# Patient Record
Sex: Male | Born: 1995 | Race: Black or African American | Hispanic: No | Marital: Single | Smoking: Former smoker
Health system: Southern US, Community
[De-identification: ages and names within clinical notes are randomized; demographics above are authoritative.]

## PROBLEM LIST (undated history)

## (undated) DIAGNOSIS — F329 Major depressive disorder, single episode, unspecified: Secondary | ICD-10-CM

## (undated) DIAGNOSIS — T1491XA Suicide attempt, initial encounter: Secondary | ICD-10-CM

## (undated) DIAGNOSIS — F32A Depression, unspecified: Secondary | ICD-10-CM

## (undated) DIAGNOSIS — F4310 Post-traumatic stress disorder, unspecified: Secondary | ICD-10-CM

## (undated) DIAGNOSIS — J45909 Unspecified asthma, uncomplicated: Secondary | ICD-10-CM

## (undated) DIAGNOSIS — F419 Anxiety disorder, unspecified: Secondary | ICD-10-CM

## (undated) HISTORY — PX: TONSILLECTOMY: SUR1361

---
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
## 2016-07-14 ENCOUNTER — Emergency Department (HOSPITAL_COMMUNITY)
Admission: EM | Admit: 2016-07-14 | Discharge: 2016-07-14 | Disposition: A | Discharge status: Home / Self Care | Payer: Medicaid Other | Attending: Emergency Medicine | Admitting: Emergency Medicine

## 2016-07-14 ENCOUNTER — Encounter (HOSPITAL_COMMUNITY): Payer: Self-pay | Admitting: Emergency Medicine

## 2016-07-14 ENCOUNTER — Emergency Department (HOSPITAL_COMMUNITY): Payer: Medicaid Other

## 2016-07-14 DIAGNOSIS — M25572 Pain in left ankle and joints of left foot: Secondary | ICD-10-CM | POA: Insufficient documentation

## 2016-07-14 DIAGNOSIS — Z7722 Contact with and (suspected) exposure to environmental tobacco smoke (acute) (chronic): Secondary | ICD-10-CM | POA: Diagnosis not present

## 2016-07-14 DIAGNOSIS — J45909 Unspecified asthma, uncomplicated: Secondary | ICD-10-CM | POA: Diagnosis not present

## 2016-07-14 DIAGNOSIS — Z79899 Other long term (current) drug therapy: Secondary | ICD-10-CM | POA: Diagnosis not present

## 2016-07-14 DIAGNOSIS — Z791 Long term (current) use of non-steroidal anti-inflammatories (NSAID): Secondary | ICD-10-CM | POA: Diagnosis not present

## 2016-07-14 HISTORY — DX: Unspecified asthma, uncomplicated: J45.909

## 2016-07-14 MED ORDER — IBUPROFEN 800 MG PO TABS
800.0000 mg | ORAL_TABLET | Freq: Once | ORAL | Status: AC
  Administered 2016-07-14: 800 mg via ORAL
  Filled 2016-07-14: qty 1

## 2016-07-14 MED ORDER — NAPROXEN 250 MG PO TABS: 250.0000 mg | ORAL_TABLET | Freq: Two times a day (BID) | ORAL | 0 refills | Status: DC

## 2016-07-14 NOTE — ED Triage Notes (Signed)
Pt reports right ankle injury occurring in 2012 that has had intermittently been giving pt pain.  Pt has no new injury to ankle, but does have bilateral ankle pain today.  PT was seen by pcp last week and was asked to be seen again in 1 month.  Pt ambulatory.

## 2016-07-14 NOTE — ED Notes (Signed)
Pt has had pain in right foot since 2012 when he injured that ankle (denies fracture). He has had pain in his left ankle since early this year when he came down on that ankle playing basket ball. Pt has bilateral dorsalis pedis pulses and cap refill < 3 seconds. No edema noted.

## 2016-07-14 NOTE — ED Provider Notes (Signed)
[facility]EMERGENCY DEPT Provider Note   CSN: [id number] Arrival date & time: 07/14/16  0911     History   Chief Complaint Chief Complaint  Patient presents with  . Ankle Pain    HPI Todd Long is a 20 y.o. male.  Todd Long is a 20 y.o. Male who is obese who presents to the emergency department with his mother complaining of bilateral ankle pain that is worse in his left ankle ongoing for several months. Patient reports he's been having right ankle pain for many years. He reports a left ankle pain has worsened recently. He denies any injury or trauma to the ankle. He would like me to check an x-ray of his left ankle today. He tells me he does lots of walking and this seems to exacerbate his pain. He reports his pain is worse on the lateral aspect of his left ankle. He reports swelling there intermittently. He denies swelling to his legs. He reports his pain is worse with movement. He denies fevers, numbness, tingling, weakness, calf pain, calf swelling, or other injury.   The history is provided by the patient and a parent. No language interpreter was used.  Ankle Pain   Pertinent negatives include no numbness.    Past Medical History:  Diagnosis Date  . Asthma     There are no active problems to display for this patient.   Past Surgical History:  Procedure Laterality Date  . TONSILLECTOMY         Home Medications    Prior to Admission medications   Medication Sig Start Date End Date Taking? Authorizing Provider  ibuprofen (ADVIL,MOTRIN) 200 MG tablet Take 400 mg by mouth every 6 (six) hours as needed for pain.    Historical Provider, MD  lisdexamfetamine (VYVANSE) 50 MG capsule Take 50 mg by mouth every morning.    Historical Provider, MD  naproxen (NAPROSYN) 250 MG tablet Take 1 tablet (250 mg total) by mouth 2 (two) times daily with a meal. 07/14/16   Everlene FarrierWilliam Jaesean Litzau, PA-C    Family History History reviewed. No pertinent family history.  Social  History Social History  Substance Use Topics  . Smoking status: Passive Smoke Exposure - Never Smoker  . Smokeless tobacco: Not on file  . Alcohol use No     Allergies   Patient has no known allergies.   Review of Systems Review of Systems  Constitutional: Negative for fever.  Cardiovascular: Negative for leg swelling.  Musculoskeletal: Positive for arthralgias and joint swelling. Negative for back pain.  Skin: Negative for color change, rash and wound.  Neurological: Negative for weakness and numbness.     Physical Exam Updated Vital Signs BP 136/82 (BP Location: Left Arm)   Pulse 86   Temp 98.4 F (36.9 C) (Oral)   Resp 20   Ht 5\' 9"  (1.753 m)   Wt (!) 158.8 kg   SpO2 94%   BMI 51.69 kg/m   Physical Exam  Constitutional: He appears well-developed and well-nourished. No distress.  Nontoxic appearing. Morbidly obese.  HENT:  Head: Normocephalic and atraumatic.  Eyes: Right eye exhibits no discharge. Left eye exhibits no discharge.  Cardiovascular: Normal rate, regular rhythm and intact distal pulses.   Bilateral dorsalis pedis and posterior tibialis pulses are intact. Good capillary refill to his bilateral distal toes.  Pulmonary/Chest: Effort normal. No respiratory distress.  Musculoskeletal: Normal range of motion. He exhibits tenderness. He exhibits no edema or deformity.  Mild tenderness to the lateral aspect  of his left ankle. No ankle edema noted. Good strength with plantar and dorsiflexion bilaterally. No calf edema or tenderness.  Neurological: He is alert. Coordination normal.  Sensation is intact in his bilateral lower extremities.  Skin: Skin is warm and dry. Capillary refill takes less than 2 seconds. No rash noted. He is not diaphoretic. No erythema. No pallor.  Psychiatric: He has a normal mood and affect. His behavior is normal.  Nursing note and vitals reviewed.    ED Treatments / Results  Labs (all labs ordered are listed, but only abnormal  results are displayed) Labs Reviewed - No data to display  EKG  EKG Interpretation None       Radiology Dg Ankle Complete Left  Result Date: 07/14/2016 CLINICAL DATA:  Chronic ankle pain EXAM: LEFT ANKLE COMPLETE - 3+ VIEW COMPARISON:  11/27/2013 FINDINGS: Three views of the left ankle submitted. No acute fracture or subluxation. Diffuse mild soft tissue swelling. Ankle mortise is preserved. IMPRESSION: No acute fracture or subluxation.  Diffuse soft tissue swelling. Electronically Signed   By: Natasha MeadLiviu  Pop M.D.   On: 07/14/2016 11:00    Procedures Procedures (including critical care time)  Medications Ordered in ED Medications  ibuprofen (ADVIL,MOTRIN) tablet 800 mg (800 mg Oral Given 07/14/16 1109)     Initial Impression / Assessment and Plan / ED Course  I have reviewed the triage vital signs and the nursing notes.  Pertinent labs & imaging results that were available during my care of the patient were reviewed by me and considered in my medical decision making (see chart for details).  Clinical Course     This is a 20 y.o. Male who is obese who presents to the emergency department with his mother complaining of bilateral ankle pain that is worse in his left ankle ongoing for several months. Patient reports he's been having right ankle pain for many years. He reports a left ankle pain has worsened recently. He denies any injury or trauma to the ankle. He would like me to check an x-ray of his left ankle today. He tells me he does lots of walking and this seems to exacerbate his pain. He reports his pain is worse on the lateral aspect of his left ankle. On exam the patient is afebrile nontoxic appearing. He is morbidly obese. He has some mild tenderness to the lateral aspect of his left ankle. No soft tissue edema noted. No deformity or ecchymosis noted. He is neurovascularly intact. X-ray shows no fracture or subluxation. Will provide the patient with an Ace wrap and discharged  with naproxen twice a day. I'm unsure of the cause of his atraumatic ankle pain. We will have him follow-up with orthopedic surgery if his pain persist as well as his primary care doctor. I advised the patient to follow-up with their primary care provider this week. I advised the patient to return to the emergency department with new or worsening symptoms or new concerns. The patient verbalized understanding and agreement with plan.      Final Clinical Impressions(s) / ED Diagnoses   Final diagnoses:  Acute left ankle pain    New Prescriptions New Prescriptions   NAPROXEN (NAPROSYN) 250 MG TABLET    Take 1 tablet (250 mg total) by mouth 2 (two) times daily with a meal.     Everlene FarrierWilliam Monia Timmers, PA-C 07/14/16 1114    Vanetta MuldersScott Zackowski, MD 07/15/16 1513

## 2017-10-11 ENCOUNTER — Emergency Department (HOSPITAL_COMMUNITY)
Admission: EM | Admit: 2017-10-11 | Discharge: 2017-10-11 | Disposition: A | Discharge status: Home / Self Care | Payer: Self-pay | Attending: Emergency Medicine | Admitting: Emergency Medicine

## 2017-10-11 ENCOUNTER — Encounter (HOSPITAL_COMMUNITY): Payer: Self-pay | Admitting: Emergency Medicine

## 2017-10-11 ENCOUNTER — Emergency Department (HOSPITAL_COMMUNITY): Payer: Self-pay

## 2017-10-11 DIAGNOSIS — J069 Acute upper respiratory infection, unspecified: Secondary | ICD-10-CM | POA: Insufficient documentation

## 2017-10-11 DIAGNOSIS — J45909 Unspecified asthma, uncomplicated: Secondary | ICD-10-CM | POA: Insufficient documentation

## 2017-10-11 DIAGNOSIS — Z7722 Contact with and (suspected) exposure to environmental tobacco smoke (acute) (chronic): Secondary | ICD-10-CM | POA: Insufficient documentation

## 2017-10-11 MED ORDER — PROMETHAZINE-DM 6.25-15 MG/5ML PO SYRP: 5.0000 mL | ORAL_SOLUTION | Freq: Four times a day (QID) | ORAL | 0 refills | Status: DC | PRN

## 2017-10-11 MED ORDER — ALBUTEROL SULFATE HFA 108 (90 BASE) MCG/ACT IN AERS
2.0000 | INHALATION_SPRAY | Freq: Once | RESPIRATORY_TRACT | Status: AC
  Administered 2017-10-11: 2 via RESPIRATORY_TRACT
  Filled 2017-10-11: qty 6.7

## 2017-10-11 MED ORDER — LIDOCAINE VISCOUS 2 % MT SOLN: 15.0000 mL | OROMUCOSAL | 0 refills | Status: DC | PRN

## 2017-10-11 MED ORDER — BENZONATATE 100 MG PO CAPS: 100.0000 mg | ORAL_CAPSULE | Freq: Three times a day (TID) | ORAL | 0 refills | Status: DC

## 2017-10-11 MED ORDER — AEROCHAMBER PLUS FLO-VU LARGE MISC: 1.0000 | Freq: Once | Status: DC

## 2017-10-11 NOTE — ED Provider Notes (Signed)
[facility] EMERGENCY DEPARTMENT Provider Note   CSN: [id number] Arrival date & time: 10/11/17  1446     History   Chief Complaint Chief Complaint  Patient presents with  . URI    HPI Todd Long is a 22 y.o. male resents to the emergency department with a chief complaint of productive cough, sore throat, nasal congestion for 4 days. He states that he checked his temperature earlier today and received a reading of either 103.0 or 100.3, but can't recall which one. He states that he woke up and his nose was dry and bleeding yesterday, which is since resolved.  He also notes one episode of NBNB emesis.  He was wheezing this morning, which is since resolved.  He denies chills, myalgias, nausea, diarrhea, abdominal pain, dyspnea, chest pain.  He is not up-to-date on his flu shot.  He works at a [employer] and has interacted with the public, but no known sick contacts.  He is treated his symptoms with Mucinex, last dose at 5 AM without improvement.  The history is provided by the patient. No language interpreter was used.  URI   Associated symptoms include vomiting (resolved), congestion, sore throat, cough and wheezing. Pertinent negatives include no chest pain, no abdominal pain, no diarrhea, no nausea, no ear pain, no sinus pain and no rash.    Past Medical History:  Diagnosis Date  . Asthma     There are no active problems to display for this patient.   Past Surgical History:  Procedure Laterality Date  . TONSILLECTOMY         Home Medications    Prior to Admission medications   Medication Sig Start Date End Date Taking? Authorizing Provider  benzonatate (TESSALON) 100 MG capsule Take 1 capsule (100 mg total) by mouth every 8 (eight) hours. 10/11/17   Caliber Landess A, PA-C  ibuprofen (ADVIL,MOTRIN) 200 MG tablet Take 400 mg by mouth every 6 (six) hours as needed for pain.    [provider]  lidocaine (XYLOCAINE) 2 % solution Use  as directed 15 mLs in the mouth or throat as needed for mouth pain. 10/11/17   Hana Trippett A, PA-C  lisdexamfetamine (VYVANSE) 50 MG capsule Take 50 mg by mouth every morning.    [provider]  naproxen (NAPROSYN) 250 MG tablet Take 1 tablet (250 mg total) by mouth 2 (two) times daily with a meal. 07/14/16   Everlene Farrier, PA-C  promethazine-dextromethorphan (PROMETHAZINE-DM) 6.25-15 MG/5ML syrup Take 5 mLs by mouth 4 (four) times daily as needed for cough. 10/11/17   Trinette Vera, Coral Else, PA-C    Family History History reviewed. No pertinent family history.  Social History Social History   Tobacco Use  . Smoking status: Passive Smoke Exposure - Never Smoker  . Smokeless tobacco: Never Used  Substance Use Topics  . Alcohol use: No  . Drug use: No     Allergies   Patient has no known allergies.   Review of Systems Review of Systems  Constitutional: Positive for fever. Negative for activity change.  HENT: Positive for congestion, nosebleeds (resolved), postnasal drip and sore throat. Negative for ear pain, facial swelling, sinus pressure and sinus pain.   Respiratory: Positive for cough and wheezing. Negative for shortness of breath.   Cardiovascular: Negative for chest pain.  Gastrointestinal: Positive for vomiting (resolved). Negative for abdominal pain, diarrhea and nausea.  Musculoskeletal: Negative for back pain.  Skin: Negative for rash.  Allergic/Immunologic: Negative for  immunocompromised state.  Neurological: Negative for dizziness and light-headedness.     Physical Exam Updated Vital Signs BP (!) 150/93 (BP Location: Right Arm)   Pulse 87   Temp 98.6 F (37 C) (Oral)   Resp 18   SpO2 99%   Physical Exam  Constitutional: He appears well-developed. No distress.  Obese, non-toxic appearing male.   HENT:  Head: Normocephalic.  Right Ear: Tympanic membrane, external ear and ear canal normal.  Left Ear: Tympanic membrane, external ear and ear canal  normal.  Nose: Mucosal edema and rhinorrhea present. Right sinus exhibits no maxillary sinus tenderness and no frontal sinus tenderness. Left sinus exhibits no maxillary sinus tenderness and no frontal sinus tenderness.  Mouth/Throat: Uvula is midline, oropharynx is clear and moist and mucous membranes are normal. No oropharyngeal exudate, posterior oropharyngeal edema, posterior oropharyngeal erythema or tonsillar abscesses.  Eyes: Conjunctivae are normal.  Neck: Neck supple.  Cardiovascular: Normal rate, regular rhythm, normal heart sounds and intact distal pulses. Exam reveals no gallop and no friction rub.  No murmur heard. Pulmonary/Chest: Effort normal. No stridor. No respiratory distress. He has no wheezes. He has no rales. He exhibits no tenderness.  Abdominal: Soft. He exhibits no distension.  Neurological: He is alert.  Skin: Skin is warm and dry. Capillary refill takes less than 2 seconds.  Psychiatric: His behavior is normal.  Nursing note and vitals reviewed.    ED Treatments / Results  Labs (all labs ordered are listed, but only abnormal results are displayed) Labs Reviewed - No data to display  EKG  EKG Interpretation None       Radiology Dg Chest 2 View  Result Date: 10/11/2017 CLINICAL DATA:  Cough, sore throat and nasal congestion for 3 days. EXAM: CHEST  2 VIEW COMPARISON:  PA and lateral chest 08/28/2007. FINDINGS: The lungs are clear. Heart size is normal. No pneumothorax or pleural effusion. No bony abnormality. IMPRESSION: Negative chest. Electronically Signed   By: Drusilla Kannerhomas  Dalessio M.D.   On: 10/11/2017 15:58    Procedures Procedures (including critical care time)  Medications Ordered in ED Medications  albuterol (PROVENTIL HFA;VENTOLIN HFA) 108 (90 Base) MCG/ACT inhaler 2 puff (not administered)  AEROCHAMBER PLUS FLO-VU LARGE MISC 1 each (not administered)     Initial Impression / Assessment and Plan / ED Course  I have reviewed the triage vital  signs and the nursing notes.  Pertinent labs & imaging results that were available during my care of the patient were reviewed by me and considered in my medical decision making (see chart for details).     22 year old male with a history of childhood asthma presenting with URI-like symptoms for 4 days.  No wheezing on my evaluation, but given history and patient's report of wheezing earlier today will provide the patient with an albuterol inhaler and spacer for home use. Pt CXR ordered by triage negative for acute infiltrate. Patients symptoms are consistent with URI, likely viral etiology. Discussed that antibiotics are not indicated for viral infections. Pt will be discharged with symptomatic treatment.  Verbalizes understanding and is agreeable with plan. Pt is well appearing, hemodynamically stable & in NAD prior to dc.  Final Clinical Impressions(s) / ED Diagnoses   Final diagnoses:  URI with cough and congestion    ED Discharge Orders        Ordered    promethazine-dextromethorphan (PROMETHAZINE-DM) 6.25-15 MG/5ML syrup  4 times daily PRN     10/11/17 1634    benzonatate (TESSALON) 100 MG  capsule  Every 8 hours     10/11/17 1634    lidocaine (XYLOCAINE) 2 % solution  As needed     10/11/17 1634       Hebe Merriwether A, PA-C 10/11/17 1639    Pricilla Loveless, MD 10/12/17 [phone]

## 2017-10-11 NOTE — ED Triage Notes (Signed)
Pt presents to ED for assessment of URI symptoms (cough, sore throat, nasal congestion) x 3 days, no fevers at home.

## 2017-10-11 NOTE — Discharge Instructions (Signed)
Take 2 puffs of the albuterol inhaler with the spacer every 4 hours as needed for wheezing or shortness of breath.  Drink 5 mL of Promethazine DM every 6 hours as needed for nasal congestion.  Take 1 tablet of benzonatate every 8 hours as needed for cough.  You can take 1 tablet of Benadryl at night to help you rest.  Sometimes the Promethazine DM will also make you somewhat drowsy.  Swallow 15 mL of viscous lidocaine every 3 hours as needed for sore throat.  Please do not take more than his account or take this medication more than every 3 hours due to side effects.  If you develop a fever, take 650 mg of Tylenol every 6 hours or 600 mg of ibuprofen with food every 8 hours.  Most employers recommend that you have been fever free, a temperature of 100.5, for more than 24 hours before returning to work.  If you develop new or worsening symptoms, including chest pain,  If you develop any new symptoms, such as shortness of breath, vomiting and diarrhea that persisted until you are not able to keep down any food or fluids, or other concerning symptoms, please return to the emergency department for re-evaluation.

## 2018-04-23 ENCOUNTER — Other Ambulatory Visit: Payer: Self-pay

## 2018-04-23 ENCOUNTER — Encounter (HOSPITAL_COMMUNITY): Payer: Self-pay | Admitting: *Deleted

## 2018-04-23 ENCOUNTER — Inpatient Hospital Stay (HOSPITAL_COMMUNITY)
Admission: AD | Admit: 2018-04-23 | Discharge: 2018-04-26 | DRG: 885 | Disposition: A | Discharge status: Home / Self Care | Payer: Federal, State, Local not specified - Other | Source: Intra-hospital | Attending: Psychiatry | Admitting: Psychiatry

## 2018-04-23 ENCOUNTER — Emergency Department (HOSPITAL_COMMUNITY)
Admission: EM | Admit: 2018-04-23 | Discharge: 2018-04-23 | Disposition: A | Discharge status: Other Acute Inpatient Hospital | Payer: Self-pay | Attending: Emergency Medicine | Admitting: Emergency Medicine

## 2018-04-23 DIAGNOSIS — Z8709 Personal history of other diseases of the respiratory system: Secondary | ICD-10-CM | POA: Diagnosis not present

## 2018-04-23 DIAGNOSIS — G4700 Insomnia, unspecified: Secondary | ICD-10-CM | POA: Diagnosis present

## 2018-04-23 DIAGNOSIS — I10 Essential (primary) hypertension: Secondary | ICD-10-CM | POA: Diagnosis present

## 2018-04-23 DIAGNOSIS — R45851 Suicidal ideations: Secondary | ICD-10-CM | POA: Insufficient documentation

## 2018-04-23 DIAGNOSIS — R440 Auditory hallucinations: Secondary | ICD-10-CM | POA: Insufficient documentation

## 2018-04-23 DIAGNOSIS — Z62810 Personal history of physical and sexual abuse in childhood: Secondary | ICD-10-CM | POA: Diagnosis present

## 2018-04-23 DIAGNOSIS — Z046 Encounter for general psychiatric examination, requested by authority: Secondary | ICD-10-CM | POA: Insufficient documentation

## 2018-04-23 DIAGNOSIS — Z7722 Contact with and (suspected) exposure to environmental tobacco smoke (acute) (chronic): Secondary | ICD-10-CM | POA: Insufficient documentation

## 2018-04-23 DIAGNOSIS — Z87891 Personal history of nicotine dependence: Secondary | ICD-10-CM

## 2018-04-23 DIAGNOSIS — J45909 Unspecified asthma, uncomplicated: Secondary | ICD-10-CM | POA: Insufficient documentation

## 2018-04-23 DIAGNOSIS — F329 Major depressive disorder, single episode, unspecified: Secondary | ICD-10-CM | POA: Insufficient documentation

## 2018-04-23 DIAGNOSIS — F4310 Post-traumatic stress disorder, unspecified: Secondary | ICD-10-CM | POA: Diagnosis present

## 2018-04-23 DIAGNOSIS — F410 Panic disorder [episodic paroxysmal anxiety] without agoraphobia: Secondary | ICD-10-CM | POA: Diagnosis present

## 2018-04-23 DIAGNOSIS — Z818 Family history of other mental and behavioral disorders: Secondary | ICD-10-CM | POA: Diagnosis not present

## 2018-04-23 DIAGNOSIS — Z9889 Other specified postprocedural states: Secondary | ICD-10-CM | POA: Diagnosis not present

## 2018-04-23 DIAGNOSIS — Z79899 Other long term (current) drug therapy: Secondary | ICD-10-CM | POA: Diagnosis not present

## 2018-04-23 DIAGNOSIS — Z9114 Patient's other noncompliance with medication regimen: Secondary | ICD-10-CM | POA: Insufficient documentation

## 2018-04-23 DIAGNOSIS — F332 Major depressive disorder, recurrent severe without psychotic features: Principal | ICD-10-CM | POA: Diagnosis present

## 2018-04-23 HISTORY — DX: Major depressive disorder, single episode, unspecified: F32.9

## 2018-04-23 HISTORY — DX: Anxiety disorder, unspecified: F41.9

## 2018-04-23 HISTORY — DX: Suicide attempt, initial encounter: T14.91XA

## 2018-04-23 HISTORY — DX: Post-traumatic stress disorder, unspecified: F43.10

## 2018-04-23 HISTORY — DX: Depression, unspecified: F32.A

## 2018-04-23 LAB — CBC
HCT: 45.7 % (ref 39.0–52.0)
HEMOGLOBIN: 15 g/dL (ref 13.0–17.0)
MCH: 29.8 pg (ref 26.0–34.0)
MCHC: 32.8 g/dL (ref 30.0–36.0)
MCV: 90.9 fL (ref 78.0–100.0)
Platelets: 297 10*3/uL (ref 150–400)
RBC: 5.03 MIL/uL (ref 4.22–5.81)
RDW: 14 % (ref 11.5–15.5)
WBC: 10.8 10*3/uL — ABNORMAL HIGH (ref 4.0–10.5)

## 2018-04-23 LAB — RAPID URINE DRUG SCREEN, HOSP PERFORMED
AMPHETAMINES: NOT DETECTED
BARBITURATES: NOT DETECTED
Benzodiazepines: NOT DETECTED
COCAINE: NOT DETECTED
OPIATES: NOT DETECTED
TETRAHYDROCANNABINOL: POSITIVE — AB

## 2018-04-23 LAB — COMPREHENSIVE METABOLIC PANEL
ALBUMIN: 3.9 g/dL (ref 3.5–5.0)
ALT: 30 U/L (ref 0–44)
AST: 20 U/L (ref 15–41)
Alkaline Phosphatase: 55 U/L (ref 38–126)
Anion gap: 9 (ref 5–15)
BUN: 19 mg/dL (ref 6–20)
CO2: 27 mmol/L (ref 22–32)
CREATININE: 0.92 mg/dL (ref 0.61–1.24)
Calcium: 9.2 mg/dL (ref 8.9–10.3)
Chloride: 106 mmol/L (ref 98–111)
GFR calc non Af Amer: >60 mL/min (ref >60)
GLUCOSE: 107 mg/dL — AB (ref 70–99)
Potassium: 3.9 mmol/L (ref 3.5–5.1)
SODIUM: 142 mmol/L (ref 135–145)
Total Bilirubin: 0.4 mg/dL (ref 0.3–1.2)
Total Protein: 7.1 g/dL (ref 6.5–8.1)

## 2018-04-23 LAB — ACETAMINOPHEN LEVEL

## 2018-04-23 LAB — SALICYLATE LEVEL

## 2018-04-23 LAB — ETHANOL: Alcohol, Ethyl (B): <10 mg/dL (ref <10)

## 2018-04-23 MED ORDER — ACETAMINOPHEN 325 MG PO TABS: 650.0000 mg | ORAL_TABLET | Freq: Four times a day (QID) | ORAL | Status: DC | PRN

## 2018-04-23 MED ORDER — ALUM & MAG HYDROXIDE-SIMETH 200-200-20 MG/5ML PO SUSP: 30.0000 mL | Freq: Four times a day (QID) | ORAL | Status: DC | PRN

## 2018-04-23 MED ORDER — ACETAMINOPHEN 325 MG PO TABS: 650.0000 mg | ORAL_TABLET | ORAL | Status: DC | PRN

## 2018-04-23 MED ORDER — MAGNESIUM HYDROXIDE 400 MG/5ML PO SUSP: 30.0000 mL | Freq: Every day | ORAL | Status: DC | PRN

## 2018-04-23 MED ORDER — TRAZODONE HCL 50 MG PO TABS
50.0000 mg | ORAL_TABLET | Freq: Every evening | ORAL | Status: DC | PRN
  Administered 2018-04-24 – 2018-04-25 (×2): 50 mg via ORAL
  Filled 2018-04-23 (×2): qty 1

## 2018-04-23 MED ORDER — ONDANSETRON HCL 4 MG PO TABS: 4.0000 mg | ORAL_TABLET | Freq: Three times a day (TID) | ORAL | Status: DC | PRN

## 2018-04-23 MED ORDER — HYDROXYZINE HCL 25 MG PO TABS
25.0000 mg | ORAL_TABLET | Freq: Three times a day (TID) | ORAL | Status: DC | PRN
  Filled 2018-04-23: qty 10

## 2018-04-23 MED ORDER — CLONAZEPAM 0.5 MG PO TABS
0.5000 mg | ORAL_TABLET | Freq: Once | ORAL | Status: AC
  Administered 2018-04-23: 0.5 mg via ORAL
  Filled 2018-04-23: qty 1

## 2018-04-23 MED ORDER — ALUM & MAG HYDROXIDE-SIMETH 200-200-20 MG/5ML PO SUSP: 30.0000 mL | ORAL | Status: DC | PRN

## 2018-04-23 NOTE — ED Notes (Signed)
BLUE, RED, GOLD save tube in main lab

## 2018-04-23 NOTE — ED Notes (Signed)
PT unable to provide urine sample at this time

## 2018-04-23 NOTE — [facility] Assessment (Signed)
[facility] Assessment Progress Note Case was staffed with Lord DNP who recommended a inpatient admission to assist with stabilization.       

## 2018-04-23 NOTE — ED Notes (Signed)
Pt A&O x 3, no distress noted, calm & cooperative, watching TV at present.  Pending report to [facility] and [facility] transfer at 9.30pm.  Report to RN Atkins, [facility].  Monitoring for safety, Q 15 min checks in effect.
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
## 2018-04-23 NOTE — BH Assessment (Signed)
BHH Assessment Progress Note Per AC patient has been accepted to [facility] 405-2 at 2200 hours. Paperwork completed.

## 2018-04-23 NOTE — ED Triage Notes (Signed)
Pt states for about a week he has had feelings of wanting to hurt self. Voices in head are telling him to do it but not how to actually harm self.

## 2018-04-23 NOTE — ED Provider Notes (Signed)
[facility]-EMERGENCY DEPT Provider Note   CSN: [id number] Arrival date & time: 04/23/18  1411     History   Chief Complaint Chief Complaint  Patient presents with  . Suicidal thoughts    HPI Todd Long is a 22 y.o. male, with a PMH of PTSD and anxiety presents with a one month history of suicidal thoughts. Pt states he does not have a plan. He notes a voice is telling him to kill himself. He denies a plan, HI, visual hallucinations. Pt states he was supposed to be on medication but has not been on it for 10 months due to lack of follow-up. Pt denies any medical PMH and any medical complaints.  HPI  Past Medical History:  Diagnosis Date  . Anxiety   . Asthma   . Depression   . PTSD (post-traumatic stress disorder)     There are no active problems to display for this patient.   Past Surgical History:  Procedure Laterality Date  . TONSILLECTOMY          Home Medications    Prior to Admission medications   Medication Sig Start Date End Date Taking? Authorizing Provider  Acetaminophen (PAIN RELIEVER PO) Take 2 tablets by mouth daily as needed (pain).   Yes [provider]  benzonatate (TESSALON) 100 MG capsule Take 1 capsule (100 mg total) by mouth every 8 (eight) hours. Patient not taking: Reported on 04/23/2018 10/11/17   McDonald, Mia A, PA-C  lidocaine (XYLOCAINE) 2 % solution Use as directed 15 mLs in the mouth or throat as needed for mouth pain. Patient not taking: Reported on 04/23/2018 10/11/17   McDonald, Pedro Earls A, PA-C  naproxen (NAPROSYN) 250 MG tablet Take 1 tablet (250 mg total) by mouth 2 (two) times daily with a meal. Patient not taking: Reported on 04/23/2018 07/14/16   Everlene Farrier, PA-C  promethazine-dextromethorphan (PROMETHAZINE-DM) 6.25-15 MG/5ML syrup Take 5 mLs by mouth 4 (four) times daily as needed for cough. Patient not taking: Reported on 04/23/2018 10/11/17   Barkley Boards, PA-C    Family History No family  history on file.  Social History Social History   Tobacco Use  . Smoking status: Passive Smoke Exposure - Never Smoker  . Smokeless tobacco: Never Used  Substance Use Topics  . Alcohol use: No  . Drug use: No     Allergies   Patient has no known allergies.   Review of Systems Review of Systems  Constitutional: Negative for chills and fever.  Respiratory: Negative for shortness of breath.   Cardiovascular: Negative for chest pain.  Gastrointestinal: Negative for abdominal pain, nausea and vomiting.  Psychiatric/Behavioral: Positive for hallucinations (auditory) and suicidal ideas. Negative for agitation.     Physical Exam Updated Vital Signs BP (!) 167/105   Pulse 83   Temp 98.1 F (36.7 C)   Resp 19   Ht 5\' 10"  (1.778 m)   SpO2 97%   BMI 50.22 kg/m   Physical Exam  Constitutional: He is oriented to person, place, and time. He appears well-developed and well-nourished.  HENT:  Head: Normocephalic and atraumatic.  Eyes: Conjunctivae and EOM are normal.  Neck: Neck supple.  Cardiovascular: Normal rate, regular rhythm and normal heart sounds.  No murmur heard. Pulmonary/Chest: Effort normal and breath sounds normal. No respiratory distress. He has no wheezes. He has no rales.  Abdominal: Soft. Bowel sounds are normal. He exhibits no distension. There is no tenderness.  Musculoskeletal: Normal range of motion. He  exhibits no tenderness or deformity.  Neurological: He is alert and oriented to person, place, and time.  Skin: Skin is warm and dry. No rash noted. No erythema.  Psychiatric: He has a normal mood and affect. His speech is normal and behavior is normal. Judgment normal. Thought content is not paranoid. He expresses suicidal ideation. He expresses no homicidal ideation. He expresses no suicidal plans.  Nursing note and vitals reviewed.    ED Treatments / Results  Labs (all labs ordered are listed, but only abnormal results are displayed) Labs Reviewed    CBC - Abnormal; Notable for the following components:      Result Value   WBC 10.8 (*)    All other components within normal limits  COMPREHENSIVE METABOLIC PANEL  ETHANOL  SALICYLATE LEVEL  ACETAMINOPHEN LEVEL  RAPID URINE DRUG SCREEN, HOSP PERFORMED    EKG None  Radiology No results found.  Procedures Procedures (including critical care time)  Medications Ordered in ED Medications - No data to display   Initial Impression / Assessment and Plan / ED Course  I have reviewed the triage vital signs and the nursing notes.  Pertinent labs & imaging results that were available during my care of the patient were reviewed by me and considered in my medical decision making (see chart for details).     3:36 PM Pt resting comfortably in bed, no acute distress, nontoxic, non lethargic. History is most consistent with advancement of his chronic psychiatric conditions. No emergent medical conditions identified at this time. Pt medically cleared for psych evaluation and possible placement.   Final Clinical Impressions(s) / ED Diagnoses   Final diagnoses:  None    ED Discharge Orders    None       Rueben Bash 04/23/18 1624    Azalia Bilis, MD 04/23/18 [phone]

## 2018-04-24 DIAGNOSIS — F332 Major depressive disorder, recurrent severe without psychotic features: Principal | ICD-10-CM

## 2018-04-24 LAB — LIPID PANEL
Cholesterol: 173 mg/dL (ref 0–200)
HDL: 39 mg/dL — ABNORMAL LOW (ref >40)
LDL CALC: 100 mg/dL — AB (ref 0–99)
Total CHOL/HDL Ratio: 4.4 RATIO
Triglycerides: 171 mg/dL — ABNORMAL HIGH (ref <150)
VLDL: 34 mg/dL (ref 0–40)

## 2018-04-24 LAB — TSH: TSH: 2.878 u[IU]/mL (ref 0.350–4.500)

## 2018-04-24 MED ORDER — PRAZOSIN HCL 1 MG PO CAPS
1.0000 mg | ORAL_CAPSULE | Freq: Every day | ORAL | Status: DC
  Administered 2018-04-24 – 2018-04-25 (×2): 1 mg via ORAL
  Filled 2018-04-24 (×2): qty 1
  Filled 2018-04-24: qty 7
  Filled 2018-04-24: qty 1
  Filled 2018-04-24: qty 7

## 2018-04-24 MED ORDER — VENLAFAXINE HCL ER 37.5 MG PO CP24
37.5000 mg | ORAL_CAPSULE | Freq: Every day | ORAL | Status: DC
  Administered 2018-04-24 – 2018-04-25 (×2): 37.5 mg via ORAL
  Filled 2018-04-24 (×4): qty 1

## 2018-04-24 NOTE — Progress Notes (Signed)
Todd Long is a 22 year old male pt admitted on voluntary basis. On admission he reports that he has been feeling depressed and suicidal and did endorse passive SI on admission about being tired of living but is able to contract for safety on the unit. He reports that he is not currently on any medications but spoke about how he would like to be on something that will help him. He denies any substance abuse issues. He reports that he was living in [place] for the past year and a half but recently moved back into the area. He reports that he currently lives with his mother and his brother and reports that he will go back to the same living situation upon discharge. Ngai was escorted to the unit, oriented to the milieu and safety maintained.

## 2018-04-24 NOTE — BHH Group Notes (Signed)
LCSW Group Therapy Note 04/24/2018 11:22 AM  Type of Therapy and Topic: Group Therapy: Overcoming Obstacles  Participation Level: Active  Description of Group:  In this group patients will be encouraged to explore what they see as obstacles to their own wellness and recovery. They will be guided to discuss their thoughts, feelings, and behaviors related to these obstacles. The group will process together ways to cope with barriers, with attention given to specific choices patients can make. Each patient will be challenged to identify changes they are motivated to make in order to overcome their obstacles. This group will be process-oriented, with patients participating in exploration of their own experiences as well as giving and receiving support and challenge from other group members.  Therapeutic Goals: 1. Patient will identify personal and current obstacles as they relate to admission. 2. Patient will identify barriers that currently interfere with their wellness or overcoming obstacles.  3. Patient will identify feelings, thought process and behaviors related to these barriers. 4. Patient will identify two changes they are willing to make to overcome these obstacles:   Summary of Patient Progress  Todd Long was engaged and participated throughout the group session. Todd Long reports that his obstacle currently is "my self-doubt". Todd Long states that he struggles with negative thinking and procrastination. Todd Long states that he knows once he is able to manage his symptoms and eliminate his negative thinking he "will be fine".    Therapeutic Modalities:  Cognitive Behavioral Therapy Solution Focused Therapy Motivational Interviewing Relapse Prevention Therapy   Alcario Drought Clinical Social Worker

## 2018-04-24 NOTE — BHH Group Notes (Signed)
Adult Psychoeducational Group Note  Date:  04/24/2018  Time: 3:15 PM  Group Topic/Focus: Self-Care  Participation Level:  Active  Participation Quality:  Appropriate and Attentive  Affect:  Appropriate  Cognitive:  Alert and Oriented  Insight: Improving  Engagement in Group:  Developing/Improving  Modes of Intervention:  Discussion and Education  Additional Comments:  Patient completed self-care assessment and contributed to the group discussion. Patient states he would like to isolate and work less and socialize with the outside world more.  Marchelle Folks A Greenlee Ancheta 04/24/2018 4:00 PM
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
## 2018-04-24 NOTE — Progress Notes (Cosign Needed)
Adult Psychoeducational Group Note  Date:  04/24/2018 Time:  7:07 PM  Group Topic/Focus:  Goals Group:   The focus of this group is to help patients establish daily goals to achieve during treatment and discuss how the patient can incorporate goal setting into their daily lives to aide in recovery.  Participation Level:  Active  Participation Quality:  Appropriate  Affect:  Appropriate  Cognitive:  Alert  Insight: Appropriate  Engagement in Group:  Engaged  Modes of Intervention:  Discussion  Additional Comments:  Pt attended group and participated in discussion.  Ashanti Littles R Akia Desroches 04/24/2018, 7:07 PM
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
## 2018-04-24 NOTE — Tx Team (Signed)
Initial Treatment Plan 04/24/2018 12:01 AM Sha Farrel Demark FTD:[id number]    PATIENT STRESSORS: Financial difficulties Marital or family conflict   PATIENT STRENGTHS: Ability for insight Average or above average intelligence Capable of independent living General fund of knowledge Motivation for treatment/growth Supportive family/friends   PATIENT IDENTIFIED PROBLEMS: Depression Suicidal thoughts "Need to find the right medicine"                     DISCHARGE CRITERIA:  Ability to meet basic life and health needs Improved stabilization in mood, thinking, and/or behavior Reduction of life-threatening or endangering symptoms to within safe limits Verbal commitment to aftercare and medication compliance  PRELIMINARY DISCHARGE PLAN: Attend aftercare/continuing care group Return to previous living arrangement  PATIENT/FAMILY INVOLVEMENT: This treatment plan has been presented to and reviewed with the patient, SHENANDOAH NORDLAND, and/or family member, .  The patient and family have been given the opportunity to ask questions and make suggestions.  Alezander Dimaano, [place], [place] 04/24/2018, 12:01 AM
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
## 2018-04-25 LAB — HEMOGLOBIN A1C
HEMOGLOBIN A1C: 5.6 % (ref 4.8–5.6)
MEAN PLASMA GLUCOSE: 114 mg/dL

## 2018-04-25 NOTE — Therapy (Signed)
Occupational Therapy Group Note  Date:  04/25/2018 Time:  2:40 PM  Group Topic/Focus:  Self Esteem Action Plan:   The focus of this group is to help patients create a plan to continue to build self-esteem after discharge.  Participation Level:  Active  Participation Quality:  Appropriate  Affect:  Pleasant/silly/joking  Cognitive:  Appropriate  Insight: Improving  Engagement in Group:  Engaged  Modes of Intervention:  Activity, Discussion, Education and Socialization  Additional Comments:    S: "It may not look like it, but my self esteem is pretty low"   O:Education given on self esteem, its definition, and how it becomes negative vs positive. Self esteem education given on its relation to MH. Pt encouraged to contribute in discussion and brainstorm. Self esteem activity completed where pt is to name a positive word for each letter of the alphabet (A-Z). Positive affirmations worksheet given at end of session for pt to practice and continue building this skill.   A: Pt presents to group with silly/joking affect, engaged and participatory throughout session with redirection. Pt occasionally talking over other members- responded well to redirection. Pt not willing to complete A-Z activity, stating "I'm bipolar I cannot just keep doing tasks my mind is all over". Educated on importance and purpose of distraction, pt still not willing. Pt completed coat of arms activity, main goal being to get clean. Accepted positive affirmations at end of session.   P: Handouts given to facilitate carryover into community.  Dalphine Handing, MSOT, OTR/L  [place] 04/25/2018, 2:40 PM

## 2018-04-25 NOTE — Progress Notes (Signed)
Adult Psychoeducational Group Note  Date:  04/25/2018 Time:  3:53 AM  Group Topic/Focus:  Wrap-Up Group:   The focus of this group is to help patients review their daily goal of treatment and discuss progress on daily workbooks.  Participation Level:  Active  Participation Quality:  Appropriate  Affect:  Appropriate  Cognitive:  Appropriate  Insight: Appropriate and Improving  Engagement in Group:  Engaged  Modes of Intervention:  Discussion  Additional Comments:  Pt stated his goal for today was to remain positive about his treatment. Pt stated he accomplished his goal today. Pt rated his over all day a 10. Pt stated he attend all groups held today. Pt stated he enjoyed all groups held today. Pt stated the groups help him interact and bond with his new peers.  Felipa Furnace 04/25/2018, 3:53 AM

## 2018-04-25 NOTE — BHH Group Notes (Signed)
[facility] Mental Health Association Group Therapy 04/25/2018 1:15pm  Type of Therapy: Mental Health Association Presentation  Participation Level: Active  Participation Quality: Attentive  Affect: Appropriate  Cognitive: Oriented  Insight: Developing/Improving  Engagement in Therapy: Engaged  Modes of Intervention: Discussion, Education and Socialization  Summary of Progress/Problems: Mental Health Association (MHA) Speaker came to talk about his personal journey with mental health. The pt processed ways by which to relate to the speaker. MHA speaker provided handouts and educational information pertaining to groups and services offered by the [facility]. Pt was engaged in speaker's presentation and was receptive to resources provided.    Rona Ravens, LCSW 04/25/2018 9:26 AM

## 2018-04-25 NOTE — Progress Notes (Signed)
Patient ID: LATASHA HOLGATE, male   DOB: 10/11/1995, 22 y.o.   MRN: [id number]  D: Patient observed to be sitting in the day room interacting with peers.  Pt loud, and animated, but interacting appropriately, and denies SI/HI/AVH.    A: Pt given all meds as scheduled.  Pt complained of insomnia, and was also given Trazodone 50mg  for sleep.  Q15 minute checks being maintained for pt's safety.  R: Pt denies any current concerns, will continue to monitor on Q15 minute safety checks.

## 2018-04-25 NOTE — [facility] Suicide Risk Assessment (Signed)
[facility] INPATIENT:  Family/Significant Other Suicide Prevention Education  Suicide Prevention Education:  Education Completed; Shalin Drewek, mother, [phone], has been identified by the patient as the family member/significant other with whom the patient will be residing, and identified as the person(s) who will aid the patient in the event of a mental health crisis (suicidal ideations/suicide attempt).  With written consent from the patient, the family member/significant other has been provided the following suicide prevention education, prior to the and/or following the discharge of the patient.  The suicide prevention education provided includes the following:  Suicide risk factors  Suicide prevention and interventions  National Suicide Hotline telephone number  [facility] assessment telephone number  [facility] Emergency Assistance 911  [facility] and/or Residential Mobile Crisis Unit telephone number  Request made of family/significant other to:  Remove weapons (e.g., guns, rifles, knives), all items previously/currently identified as safety concern.  No guns in the home per mom.   Remove drugs/medications (over-the-counter, prescriptions, illicit drugs), all items previously/currently identified as a safety concern.  The family member/significant other verbalizes understanding of the suicide prevention education information provided.  The family member/significant other agrees to remove the items of safety concern listed above.   She visited pt last night, he was talking and laughing, seems to be doing good.  But then whenever he gets upset, he will post something on facebook about "you guys will miss me when I'm gone."  Pt has good history and never has trouble finding a job.  He is mostly interested in his music.  Discussed probably discharge tomorrow.    Lorri Frederick, LCSW 04/25/2018, 3:02 PM

## 2018-04-25 NOTE — Progress Notes (Signed)
[facility] MD Progress Note  04/25/2018 11:37 AM Todd Long  MRN:  [id number] Subjective: Patient is seen and examined.  Patient is a 22 year old male with a past psychiatric history significant for major depression, probable posttraumatic stress disorder, as well as a history of attention deficit disorder.  He is seen in follow-up.  He is doing much better today.  He is able to smile and engage.  He is participating in groups.  He feels like the groups are helping him develop better coping skills.  He denied any nightmares or flashbacks with his medications last night.  His blood pressure has been elevated today.  He feels like he is progressing well.  He denied any suicidal ideation today. Principal Problem: <principal problem not specified> Diagnosis:   Patient Active Problem List   Diagnosis Date Noted  . Severe recurrent major depression without psychotic features (HCC) [F33.2] 04/23/2018   Total Time spent with patient: 15 minutes  Past Psychiatric History: See admission H&P  Past Medical History:  Past Medical History:  Diagnosis Date  . Anxiety   . Asthma   . Depression   . PTSD (post-traumatic stress disorder)   . Suicidal behavior with attempted self-injury [facility])     Past Surgical History:  Procedure Laterality Date  . TONSILLECTOMY     Family History: History reviewed. No pertinent family history. Family Psychiatric  History: See admission H&P Social History:  Social History   Substance and Sexual Activity  Alcohol Use No     Social History   Substance and Sexual Activity  Drug Use No    Social History   Socioeconomic History  . Marital status: Single    Spouse name: Not on file  . Number of children: Not on file  . Years of education: Not on file  . Highest education level: Not on file  Occupational History  . Not on file  Social Needs  . Financial resource strain: Not on file  . Food insecurity:    Worry: Not on file    Inability: Not on file  .  Transportation needs:    Medical: Not on file    Non-medical: Not on file  Tobacco Use  . Smoking status: Former Games developer  . Smokeless tobacco: Never Used  Substance and Sexual Activity  . Alcohol use: No  . Drug use: No  . Sexual activity: Not Currently  Lifestyle  . Physical activity:    Days per week: Not on file    Minutes per session: Not on file  . Stress: Not on file  Relationships  . Social connections:    Talks on phone: Not on file    Gets together: Not on file    Attends religious service: Not on file    Active member of club or organization: Not on file    Attends meetings of clubs or organizations: Not on file    Relationship status: Not on file  Other Topics Concern  . Not on file  Social History Narrative  . Not on file   Additional Social History:                         Sleep: Good  Appetite:  Good  Current Medications: Current Facility-Administered Medications  Medication Dose Route Frequency Provider Last Rate Last Dose  . acetaminophen (TYLENOL) tablet 650 mg  650 mg Oral Q6H PRN Jackelyn Poling, NP      . alum & mag hydroxide-simeth (  MAALOX/MYLANTA) 200-200-20 MG/5ML suspension 30 mL  30 mL Oral Q4H PRN Nira Conn A, NP      . hydrOXYzine (ATARAX/VISTARIL) tablet 25 mg  25 mg Oral TID PRN Nira Conn A, NP      . magnesium hydroxide (MILK OF MAGNESIA) suspension 30 mL  30 mL Oral Daily PRN Nira Conn A, NP      . prazosin (MINIPRESS) capsule 1 mg  1 mg Oral QHS Antonieta Pert, MD   1 mg at 04/24/18 2118  . traZODone (DESYREL) tablet 50 mg  50 mg Oral QHS PRN Nira Conn A, NP   50 mg at 04/24/18 2119  . venlafaxine XR (EFFEXOR-XR) 24 hr capsule 37.5 mg  37.5 mg Oral Q breakfast Antonieta Pert, MD   37.5 mg at 04/25/18 0827    Lab Results:  Results for orders placed or performed during the hospital encounter of 04/23/18 (from the past 48 hour(s))  Hemoglobin A1c     Status: None   Collection Time: 04/24/18  6:23 AM  Result  Value Ref Range   Hgb A1c MFr Bld 5.6 4.8 - 5.6 %    Comment: (NOTE)         Prediabetes: 5.7 - 6.4         Diabetes: >6.4         Glycemic control for adults with diabetes: <7.0    Mean Plasma Glucose 114 mg/dL    Comment: (NOTE) Performed At: [facility] [address] [place], [place] [id number] Jolene Schimke MD WU:[id number]   Lipid panel     Status: Abnormal   Collection Time: 04/24/18  6:23 AM  Result Value Ref Range   Cholesterol 173 0 - 200 mg/dL   Triglycerides 956 (H) <150 mg/dL   HDL 39 (L) >21 mg/dL   Total CHOL/HDL Ratio 4.4 RATIO   VLDL 34 0 - 40 mg/dL   LDL Cholesterol 308 (H) 0 - 99 mg/dL    Comment:        Total Cholesterol/HDL:CHD Risk Coronary Heart Disease Risk Table                     Men   Women  1/2 Average Risk   3.4   3.3  Average Risk       5.0   4.4  2 X Average Risk   9.6   7.1  3 X Average Risk  23.4   11.0        Use the calculated Patient Ratio above and the CHD Risk Table to determine the patient's CHD Risk.        ATP III CLASSIFICATION (LDL):  <100     mg/dL   Optimal  657-846  mg/dL   Near or Above                    Optimal  130-159  mg/dL   Borderline  962-952  mg/dL   High  >841     mg/dL   Very High Performed at [facility], [address], [place], [place] [address]   TSH     Status: None   Collection Time: 04/24/18  6:23 AM  Result Value Ref Range   TSH 2.878 0.350 - 4.500 uIU/mL    Comment: Performed by a 3rd Generation assay with a functional sensitivity of <=0.01 uIU/mL. Performed at [facility], [address] [place], [place] [address]     Blood  Alcohol level:  Lab Results  Component Value Date   ETH <10 04/23/2018    Metabolic Disorder Labs: Lab Results  Component Value Date   HGBA1C 5.6 04/24/2018   MPG 114 04/24/2018   No results found for: PROLACTIN Lab Results  Component Value Date   CHOL 173 04/24/2018   TRIG 171 (H) 04/24/2018   HDL 39 (L)  04/24/2018   CHOLHDL 4.4 04/24/2018   VLDL 34 04/24/2018   LDLCALC 100 (H) 04/24/2018    Physical Findings: AIMS: Facial and Oral Movements Muscles of Facial Expression: None, normal Lips and Perioral Area: None, normal Jaw: None, normal Tongue: None, normal,Extremity Movements Upper (arms, wrists, hands, fingers): None, normal Lower (legs, knees, ankles, toes): None, normal, Trunk Movements Neck, shoulders, hips: None, normal, Overall Severity Severity of abnormal movements (highest score from questions above): None, normal Incapacitation due to abnormal movements: None, normal Patient's awareness of abnormal movements (rate only patient's report): No Awareness, Dental Status Current problems with teeth and/or dentures?: No Does patient usually wear dentures?: No  CIWA:    COWS:     Musculoskeletal: Strength & Muscle Tone: within normal limits Gait & Station: normal Patient leans: N/A  Psychiatric Specialty Exam: Physical Exam  Nursing note and vitals reviewed. Constitutional: He is oriented to person, place, and time. He appears well-developed and well-nourished.  HENT:  Head: Normocephalic and atraumatic.  Respiratory: Effort normal.  Neurological: He is alert and oriented to person, place, and time.    ROS  Blood pressure (!) 151/83, pulse 98, temperature 97.9 F (36.6 C), temperature source Oral, resp. rate 20, height 5\' 8"  (1.727 m), weight (!) 169.6 kg.Body mass index is 56.87 kg/m.  General Appearance: Casual  Eye Contact:  Fair  Speech:  Normal Rate  Volume:  Normal  Mood:  Euthymic  Affect:  Congruent  Thought Process:  Coherent and Descriptions of Associations: Intact  Orientation:  Full (Time, Place, and Person)  Thought Content:  Logical  Suicidal Thoughts:  No  Homicidal Thoughts:  No  Memory:  Immediate;   Fair Recent;   Fair Remote;   Fair  Judgement:  Intact  Insight:  Good  Psychomotor Activity:  Normal  Concentration:  Concentration: Fair  and Attention Span: Fair  Recall:  [employer] of Knowledge:  Fair  Language:  Fair  Akathisia:  Negative  Handed:  Right  AIMS (if indicated):     Assets:  Communication Skills Desire for Improvement Financial Resources/Insurance Housing Physical Health Resilience Social Support  ADL's:  Intact  Cognition:  WNL  Sleep:  Number of Hours: 6.25     Treatment Plan Summary: Daily contact with patient to assess and evaluate symptoms and progress in treatment, Medication management and Plan : Patient is seen and examined.  Patient is a 22 year old male with the above-stated past psychiatric history is seen in follow-up.  #1 major depression/posttraumatic stress disorder-we will continue the venlafaxine XR 37.5 mg p.o. daily.  He has tolerated the prazosin 1 mg p.o. nightly for nightmares and flashbacks.  No change in these medications.  #2 hypertension we will discuss with him today possible treatment options for his blood pressure issues.  I want to make sure what he is been treated with in the past before we start anything.  #3 discharge planning-if all continues to go well we will plan on discharge in 1 to 2 days.  Antonieta Pert, MD 04/25/2018, 11:37 AM

## 2018-04-25 NOTE — Progress Notes (Signed)
Patient ID: Todd Long, male   DOB: [birth year], 22 y.o.   MRN: [id number]  Nursing Progress Note [id number]  Data: Patient presents with pleasant mood and animated/silly affect. Patient complaint with scheduled medications. Patient denies pain/physical complaints. Patient completed self-inventory sheet and rates depression, hopelessness, and anxiety 0,0,0 respectively. Patient rates their sleep and appetite as good/fair respectively. Patient states goal for today is to "continue to be positive and speak out more in group". Patient is seen attending groups and visible in the milieu. Patient currently denies SI/HI/AVH.   Action: Patient educated about and provided medication per provider's orders. Patient safety maintained with q15 min safety checks and frequent rounding. Low fall risk precautions in place. Emotional support given. 1:1 interaction and active listening provided. Patient encouraged to attend meals and groups. Patient encouraged to work on treatment plan and goals. Labs, vital signs and patient behavior monitored throughout shift.   Response: Patient remains safe on the unit at this time. Patient is interacting with peers appropriately on the unit. Will continue to support and monitor.

## 2018-04-25 NOTE — Progress Notes (Signed)
Pt has been in the dayroom most of the evening talking and socializing with other patients.  He was very jovial and laughing a lot.  He told Clinical research associate that he liked to make people laugh.  He denies SI/HI/AVH at this time.  He voiced no needs or concerns this evening.  Pt has been cooperative and appropriate.  Support and encouragement offered.  Discharge plans are in process.  Safety maintained with q15 minute checks.

## 2018-04-25 NOTE — Tx Team (Signed)
Interdisciplinary Treatment and Diagnostic Plan Update  04/25/2018 Time of Session: 0949 DAGAN TERRACINA MRN: [id number]  Principal Diagnosis: <principal problem not specified>  Secondary Diagnoses: Active Problems:   Severe recurrent major depression without psychotic features (HCC)   Current Medications:  Current Facility-Administered Medications  Medication Dose Route Frequency Provider Last Rate Last Dose  . acetaminophen (TYLENOL) tablet 650 mg  650 mg Oral Q6H PRN Jackelyn Poling, NP      . alum & mag hydroxide-simeth (MAALOX/MYLANTA) 200-200-20 MG/5ML suspension 30 mL  30 mL Oral Q4H PRN Nira Conn A, NP      . hydrOXYzine (ATARAX/VISTARIL) tablet 25 mg  25 mg Oral TID PRN Nira Conn A, NP      . magnesium hydroxide (MILK OF MAGNESIA) suspension 30 mL  30 mL Oral Daily PRN Nira Conn A, NP      . prazosin (MINIPRESS) capsule 1 mg  1 mg Oral QHS Antonieta Pert, MD   1 mg at 04/24/18 2118  . traZODone (DESYREL) tablet 50 mg  50 mg Oral QHS PRN Nira Conn A, NP   50 mg at 04/24/18 2119  . venlafaxine XR (EFFEXOR-XR) 24 hr capsule 37.5 mg  37.5 mg Oral Q breakfast Antonieta Pert, MD   37.5 mg at 04/25/18 0827   PTA Medications: Medications Prior to Admission  Medication Sig Dispense Refill Last Dose  . Acetaminophen (PAIN RELIEVER PO) Take 2 tablets by mouth daily as needed (pain).   Past Month at Unknown time  . benzonatate (TESSALON) 100 MG capsule Take 1 capsule (100 mg total) by mouth every 8 (eight) hours. (Patient not taking: Reported on 04/23/2018) 21 capsule 0 Not Taking at Unknown time  . lidocaine (XYLOCAINE) 2 % solution Use as directed 15 mLs in the mouth or throat as needed for mouth pain. (Patient not taking: Reported on 04/23/2018) 100 mL 0 Not Taking at Unknown time  . naproxen (NAPROSYN) 250 MG tablet Take 1 tablet (250 mg total) by mouth 2 (two) times daily with a meal. (Patient not taking: Reported on 04/23/2018) 30 tablet 0 Not Taking at Unknown time  .  promethazine-dextromethorphan (PROMETHAZINE-DM) 6.25-15 MG/5ML syrup Take 5 mLs by mouth 4 (four) times daily as needed for cough. (Patient not taking: Reported on 04/23/2018) 118 mL 0 Not Taking at Unknown time    Patient Stressors: Financial difficulties Marital or family conflict  Patient Strengths: Ability for insight Average or above average intelligence Capable of independent living [employer] of knowledge Motivation for treatment/growth Supportive family/friends  Treatment Modalities: Medication Management, Group therapy, Case management,  1 to 1 session with clinician, Psychoeducation, Recreational therapy.   Physician Treatment Plan for Primary Diagnosis: <principal problem not specified> Long Term Goal(s): Improvement in symptoms so as ready for discharge Improvement in symptoms so as ready for discharge   Short Term Goals: Ability to identify changes in lifestyle to reduce recurrence of condition will improve Ability to verbalize feelings will improve Ability to disclose and discuss suicidal ideas Ability to demonstrate self-control will improve Ability to identify and develop effective coping behaviors will improve Ability to maintain clinical measurements within normal limits will improve Ability to identify changes in lifestyle to reduce recurrence of condition will improve Ability to verbalize feelings will improve Ability to disclose and discuss suicidal ideas Ability to demonstrate self-control will improve Ability to identify and develop effective coping behaviors will improve Ability to maintain clinical measurements within normal limits will improve  Medication Management: Evaluate patient's response,  side effects, and tolerance of medication regimen.  Therapeutic Interventions: 1 to 1 sessions, Unit Group sessions and Medication administration.  Evaluation of Outcomes: Progressing  Physician Treatment Plan for Secondary Diagnosis: Active Problems:    Severe recurrent major depression without psychotic features (HCC)  Long Term Goal(s): Improvement in symptoms so as ready for discharge Improvement in symptoms so as ready for discharge   Short Term Goals: Ability to identify changes in lifestyle to reduce recurrence of condition will improve Ability to verbalize feelings will improve Ability to disclose and discuss suicidal ideas Ability to demonstrate self-control will improve Ability to identify and develop effective coping behaviors will improve Ability to maintain clinical measurements within normal limits will improve Ability to identify changes in lifestyle to reduce recurrence of condition will improve Ability to verbalize feelings will improve Ability to disclose and discuss suicidal ideas Ability to demonstrate self-control will improve Ability to identify and develop effective coping behaviors will improve Ability to maintain clinical measurements within normal limits will improve     Medication Management: Evaluate patient's response, side effects, and tolerance of medication regimen.  Therapeutic Interventions: 1 to 1 sessions, Unit Group sessions and Medication administration.  Evaluation of Outcomes: Progressing   RN Treatment Plan for Primary Diagnosis: <principal problem not specified> Long Term Goal(s): Knowledge of disease and therapeutic regimen to maintain health will improve  Short Term Goals: Ability to identify and develop effective coping behaviors will improve and Compliance with prescribed medications will improve  Medication Management: RN will administer medications as ordered by provider, will assess and evaluate patient's response and provide education to patient for prescribed medication. RN will report any adverse and/or side effects to prescribing provider.  Therapeutic Interventions: 1 on 1 counseling sessions, Psychoeducation, Medication administration, Evaluate responses to treatment, Monitor  vital signs and CBGs as ordered, Perform/monitor CIWA, COWS, AIMS and Fall Risk screenings as ordered, Perform wound care treatments as ordered.  Evaluation of Outcomes: Progressing   LCSW Treatment Plan for Primary Diagnosis: <principal problem not specified> Long Term Goal(s): Safe transition to appropriate next level of care at discharge, Engage patient in therapeutic group addressing interpersonal concerns.  Short Term Goals: Engage patient in aftercare planning with referrals and resources, Increase social support and Increase skills for wellness and recovery  Therapeutic Interventions: Assess for all discharge needs, 1 to 1 time with Social worker, Explore available resources and support systems, Assess for adequacy in community support network, Educate family and significant other(s) on suicide prevention, Complete Psychosocial Assessment, Interpersonal group therapy.  Evaluation of Outcomes: Progressing   Progress in Treatment: Attending groups: Yes. Participating in groups: Yes. Taking medication as prescribed: Yes. Toleration medication: Yes. Family/Significant other contact made: No, will contact:  mother Patient understands diagnosis: Yes. Discussing patient identified problems/goals with staff: Yes. Medical problems stabilized or resolved: Yes. Denies suicidal/homicidal ideation: Yes. Issues/concerns per patient self-inventory: No. Other: none  New problem(s) identified: No, Describe:  none  New Short Term/Long Term Goal(s):  Patient Goals:  "better mindset, remain positive"  Discharge Plan or Barriers:   Reason for Continuation of Hospitalization: Depression Medication stabilization  Estimated Length of Stay: 1-2 days.  Attendees: Patient:Todd Long 04/25/2018   Physician: Dr. Jola Babinski, MD 04/25/2018   Nursing: Thelma Comp, RN 04/25/2018   RN Care Manager: 04/25/2018   Social Worker: Daleen Squibb, LCSW 04/25/2018   Recreational Therapist:  04/25/2018    Other:  04/25/2018   Other:  04/25/2018  Other: 04/25/2018        Scribe  for Treatment Team: Wyn Quaker 04/25/2018 2:45 PM

## 2018-04-25 NOTE — Progress Notes (Signed)
Recreation Therapy Notes  Date: 9.11.19 Time: 0930 Location: [address] Dayroom  Group Topic: Stress Management  Goal Area(s) Addresses:  Patient will verbalize importance of using healthy stress management.  Patient will identify positive emotions associated with healthy stress management.   Intervention: Stress Management  Activity :  Guided Imagery.  LRT introduced the stress management technique of guided imagery.  LRT read a script for patients to envision seeing a starry sky at night.  Patients were to follow along as script was read to engage in activity.  Education:  Stress Management, Discharge Planning.   Education Outcome: Acknowledges edcuation/In group clarification offered/Needs additional education  Clinical Observations/Feedback: Pt did not attend group.    Ceairra Mccarver, LRT/CTRS         Starnisha Batrez A 04/25/2018 12:08 PM 

## 2018-04-26 MED ORDER — TRAZODONE HCL 100 MG PO TABS
100.0000 mg | ORAL_TABLET | Freq: Every evening | ORAL | Status: DC | PRN
  Filled 2018-04-26: qty 7

## 2018-04-26 MED ORDER — VENLAFAXINE HCL ER 75 MG PO CP24: 75.0000 mg | ORAL_CAPSULE | Freq: Every day | ORAL | 0 refills | Status: AC

## 2018-04-26 MED ORDER — TRAZODONE HCL 100 MG PO TABS: 100.0000 mg | ORAL_TABLET | Freq: Every evening | ORAL | 0 refills | Status: AC | PRN

## 2018-04-26 MED ORDER — HYDROXYZINE HCL 25 MG PO TABS: 25.0000 mg | ORAL_TABLET | Freq: Three times a day (TID) | ORAL | 0 refills | Status: DC | PRN

## 2018-04-26 MED ORDER — VENLAFAXINE HCL ER 75 MG PO CP24
75.0000 mg | ORAL_CAPSULE | Freq: Every day | ORAL | Status: DC
  Administered 2018-04-26: 75 mg via ORAL
  Filled 2018-04-26: qty 7
  Filled 2018-04-26: qty 1
  Filled 2018-04-26: qty 7

## 2018-04-26 MED ORDER — PRAZOSIN HCL 1 MG PO CAPS: 1.0000 mg | ORAL_CAPSULE | Freq: Every day | ORAL | 0 refills | Status: AC

## 2018-04-26 NOTE — BHH Suicide Risk Assessment (Signed)
[facility]   Principal Problem: <principal problem not specified> Discharge Diagnoses:  Patient Active Problem List   Diagnosis Date Noted  . Severe recurrent major depression without psychotic features (HCC) [F33.2] 04/23/2018    Total Time spent with patient: 15 minutes  Musculoskeletal: Strength & Muscle Tone: within normal limits Gait & Station: normal Patient leans: N/A  Psychiatric Specialty Exam: Review of Systems  All other systems reviewed and are negative.   Blood pressure (!) 121/101, pulse (!) 122, temperature 97.7 F (36.5 C), temperature source Oral, resp. rate 20, height 5\' 8"  (1.727 m), weight (!) 169.6 kg.Body mass index is 56.87 kg/m.  General Appearance: Casual  Eye Contact::  Good  Speech:  Normal Rate409  Volume:  Normal  Mood:  Euthymic  Affect:  Congruent  Thought Process:  Coherent and Descriptions of Associations: Intact  Orientation:  Full (Time, Place, and Person)  Thought Content:  Logical  Suicidal Thoughts:  No  Homicidal Thoughts:  No  Memory:  Immediate;   Fair Recent;   Fair Remote;   Fair  Judgement:  Intact  Insight:  Fair  Psychomotor Activity:  Normal  Concentration:  Good  Recall:  FiservFair  Fund of Knowledge:Fair  Language: Fair  Akathisia:  Negative  Handed:  Right  AIMS (if indicated):     Assets:  Communication Skills Desire for Improvement Housing Leisure Time Physical Health Resilience Social Support Talents/Skills  Sleep:  Number of Hours: 5.25  Cognition: WNL  ADL's:  Intact   Mental Status Per Nursing Assessment::   On Admission:  Suicidal ideation indicated by patient, Self-harm thoughts  Demographic Factors:  Male, Adolescent or young adult and Low socioeconomic status  Loss Factors: NA  Historical Factors: Impulsivity  Risk Reduction Factors:   Sense of responsibility to family, Employed, Living with another person, especially a relative, Positive social support and  Positive coping skills or problem solving skills  Continued Clinical Symptoms:  Depression:   Impulsivity  Cognitive Features That Contribute To Risk:  None    Suicide Risk:  Minimal: No identifiable suicidal ideation.  Patients presenting with no risk factors but with morbid ruminations; may be classified as minimal risk based on the severity of the depressive symptoms  Follow-up Information    [facility] on 05/01/2018.   Specialty:  Behavioral Health Why:  Please attend your appt on Tuesday, 05/01/18, at 8:00am. Contact information: [address] [place] [address] [phone]           Plan Of Care/Follow-up recommendations:  Activity:  ad lib  Antonieta PertGreg Lawson Clary, MD 04/26/2018, 7:31 AM
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
## 2018-04-26 NOTE — Progress Notes (Addendum)
  [facility] :  Will you be returning to the same living situation after discharge:  Yes,  with mom At discharge, do you have transportation home?: Yes,  mom Do you have the ability to pay for your medications: No. [facility].  Release of information consent forms completed and in the chart;  Patient's signature needed at discharge.  Patient to Follow up at: Follow-up Information    [facility] Go on 05/01/2018.   Specialty:  Behavioral Health Why:  Please attend your appt on Tuesday, 05/01/18, at 8:00am. Contact information: [address] [place] [address] [phone]           Next level of care provider has access to [facility] Link:no  Safety Planning and Suicide Prevention discussed: Yes,  with mom  Have you used any form of tobacco in the last 30 days? (Cigarettes, Smokeless Tobacco, Cigars, and/or Pipes): No  Has patient been referred to the Quitline?: N/A patient is not a smoker  Patient has been referred for addiction treatment: Yes  Lorri FrederickWierda, Todd Pressly Jon, LCSW 04/26/2018, 11:06 AM

## 2018-04-26 NOTE — Progress Notes (Signed)
D: Pt A & O X 4. Denies SI, HI, AVH and pain at this time. Pt reports fair sleep, good appetite, good concentration and is hyperactive and animated on interactions. Rates his depression, hopelessness and anxiety all 0/10. D/C home as ordered. Picked up in lobby by his mother. A: D/C instructions reviewed with pt including prescriptions, medication samples and follow up appointment; compliance encouraged. All belongings from locker # 22 given to pt at time of departure. Scheduled and PRN medications given with verbal education and effects monitored. Safety checks maintained without incident till time of d/c.  R: Pt receptive to care. Compliant with medications when offered. Denies adverse drug reactions when assessed. Verbalized understanding related to d/c instructions. Signed belonging sheet in agreement with items received from locker. Ambulatory with a steady gait. Appears to be in no physical distress at time of departure.

## 2019-09-08 ENCOUNTER — Other Ambulatory Visit: Payer: Self-pay

## 2019-09-08 ENCOUNTER — Emergency Department (HOSPITAL_COMMUNITY)
Admission: EM | Admit: 2019-09-08 | Discharge: 2019-09-08 | Disposition: A | Discharge status: Home / Self Care | Payer: Medicaid Other | Attending: Emergency Medicine | Admitting: Emergency Medicine

## 2019-09-08 ENCOUNTER — Encounter (HOSPITAL_COMMUNITY): Payer: Self-pay | Admitting: Emergency Medicine

## 2019-09-08 DIAGNOSIS — Z79899 Other long term (current) drug therapy: Secondary | ICD-10-CM | POA: Insufficient documentation

## 2019-09-08 DIAGNOSIS — R112 Nausea with vomiting, unspecified: Secondary | ICD-10-CM

## 2019-09-08 DIAGNOSIS — Z87891 Personal history of nicotine dependence: Secondary | ICD-10-CM | POA: Insufficient documentation

## 2019-09-08 MED ORDER — FAMOTIDINE 20 MG PO TABS: 20.0000 mg | ORAL_TABLET | Freq: Two times a day (BID) | ORAL | 0 refills | Status: DC

## 2019-09-08 NOTE — Discharge Instructions (Addendum)
You can slowly advance your diet as tolerated. Take Pepcid as needed for discomfort. Return to the ED if you start to develop worsening abdominal pain, chest pain, shortness of breath, vomiting or coughing up blood.

## 2019-09-08 NOTE — ED Triage Notes (Signed)
Pt reports multiple episodes of vomiting last night. Endorses nausea. Reports being able to keep down some Gatorade.

## 2019-09-08 NOTE — ED Provider Notes (Signed)
[facility] EMERGENCY DEPARTMENT Provider Note   CSN: [id number] Arrival date & time: 09/08/19  1605     History Chief Complaint  Patient presents with  . Emesis    Todd Long is a 24 y.o. male with a past medical history of anxiety, asthma, depression presenting to the ED with a chief complaint of emesis.  Yesterday has several episodes of nonbloody, nonbilious emesis.  He woke up this morning felt nauseous but was able to hold down Gatorade.  He was concerned due to working at [employer].  He denies any prior or current abdominal pain, diarrhea, sick contacts or similar symptoms, fever, prior abdominal surgeries, urinary symptoms.  HPI     Past Medical History:  Diagnosis Date  . Anxiety   . Asthma   . Depression   . PTSD (post-traumatic stress disorder)   . Suicidal behavior with attempted self-injury [facility])     Patient Active Problem List   Diagnosis Date Noted  . 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 TONSILLECTOMY         No family history on file.  Social History   Tobacco Use  . Smoking status: Former Games developer  . Smokeless tobacco: Never Used  Substance Use Topics  . Alcohol use: No  . Drug use: No    Home Medications Prior to Admission medications   Medication Sig Start Date End Date Taking? Authorizing Provider  famotidine (PEPCID) 20 MG tablet Take 1 tablet (20 mg total) by mouth 2 (two) times daily. 09/08/19   Zyquan Crotty, PA-C  hydrOXYzine (ATARAX/VISTARIL) 25 MG tablet Take 1 tablet (25 mg total) by mouth 3 (three) times daily as needed for anxiety. 04/26/18   Money, Gerlene Burdock, FNP  prazosin (MINIPRESS) 1 MG capsule Take 1 capsule (1 mg total) by mouth at bedtime. For nightmares 04/26/18   Money, Gerlene Burdock, FNP  traZODone (DESYREL) 100 MG tablet Take 1 tablet (100 mg total) by mouth at bedtime as needed for sleep. 04/26/18   Money,  Gerlene Burdock, FNP  venlafaxine XR (EFFEXOR-XR) 75 MG 24 hr capsule Take 1 capsule (75 mg total) by mouth daily with breakfast. For mood ocntrol 04/27/18   Money, Gerlene Burdock, FNP    Allergies    Patient has no known allergies.  Review of Systems   Review of Systems  Constitutional: Negative for appetite change, chills and fever.  HENT: Negative for ear pain, rhinorrhea, sneezing and sore throat.   Eyes: Negative for photophobia and visual disturbance.  Respiratory: Negative for cough, chest tightness, shortness of breath and wheezing.   Cardiovascular: Negative for chest pain and palpitations.  Gastrointestinal: Positive for nausea and vomiting. Negative for abdominal pain, blood in stool, constipation and diarrhea.  Genitourinary: Negative for dysuria, hematuria and urgency.  Musculoskeletal: Negative for myalgias.  Skin: Negative for rash.  Neurological: Negative for dizziness, weakness and light-headedness.    Physical Exam Updated Vital Signs BP 129/81 (BP Location: Right Arm)   Pulse 86   Temp 97.7 F (36.5 C) (Oral)   Resp 18   SpO2 97%   Physical Exam Vitals and nursing note reviewed.  Constitutional:      General: He is not in acute distress.    Appearance: He is well-developed. He is obese.  HENT:     Head: Normocephalic and atraumatic.     Nose: Nose normal.  Eyes:  General: No scleral icterus.       Left eye: No discharge.     Conjunctiva/sclera: Conjunctivae normal.  Cardiovascular:     Rate and Rhythm: Normal rate and regular rhythm.     Heart sounds: Normal heart sounds. No murmur. No friction rub. No gallop.   Pulmonary:     Effort: Pulmonary effort is normal. No respiratory distress.     Breath sounds: Normal breath sounds.  Abdominal:     General: Bowel sounds are normal. There is no distension.     Palpations: Abdomen is soft.     Tenderness: There is no abdominal tenderness. There is no guarding.  Musculoskeletal:        General: Normal range of  motion.     Cervical back: Normal range of motion and neck supple.  Skin:    General: Skin is warm and dry.     Findings: No rash.  Neurological:     Mental Status: He is alert.     Motor: No abnormal muscle tone.     Coordination: Coordination normal.     ED Results / Procedures / Treatments   Labs (all labs ordered are listed, but only abnormal results are displayed) Labs Reviewed - No data to display  EKG None  Radiology No results found.  Procedures Procedures (including critical care time)  Medications Ordered in ED Medications - No data to display  ED Course  I have reviewed the triage vital signs and the nursing notes.  Pertinent labs & imaging results that were available during my care of the patient were reviewed by me and considered in my medical decision making (see chart for details).    MDM Rules/Calculators/A&P                      24 year old male presenting to the ED for emesis since yesterday.  Symptoms resolved when he woke up this morning and was able to tolerate p.o. intake without difficulty.  His main reason of presenting to the ED today is because he works at [employer] and was concerned due to his symptoms.  On exam his abdomen is soft, nontender nondistended.  He denies abdominal pain, urinary symptoms or changes to bowel movements.  I suspect that his symptoms could be viral in nature as his symptoms resolved without intervention and he is able to tolerate p.o. intake here.  He is comfortable with discharge home with a work note and slowly advancing his diet as tolerated.  I doubt appendicitis, cholecystitis or other emergent cause of symptoms based on his reassuring work-up and vital signs.  Patient given return precautions.  Patient is hemodynamically stable, in NAD, and able to ambulate in the ED. Evaluation does not show pathology that would require ongoing emergent intervention or inpatient treatment. I explained the diagnosis to the patient.  Pain has been managed and has no complaints prior to discharge. Patient is comfortable with above plan and is stable for discharge at this time. All questions were answered prior to disposition. Strict return precautions for returning to the ED were discussed. Encouraged follow up with PCP.   An After Visit Summary was printed and given to the patient.   Portions of this note were generated with Scientist, clinical (histocompatibility and immunogenetics). Dictation errors may occur despite best attempts at proofreading.  Final Clinical Impression(s) / ED Diagnoses Final diagnoses:  Non-intractable vomiting with nausea, unspecified vomiting type    Rx / DC Orders ED Discharge Orders  Ordered    famotidine (PEPCID) 20 MG tablet  2 times daily     09/08/19 1724           Delia Heady, PA-C 09/08/19 1724    Wyvonnia Dusky, MD 09/08/19 2101

## 2019-10-31 ENCOUNTER — Encounter (HOSPITAL_COMMUNITY): Payer: Self-pay

## 2019-10-31 ENCOUNTER — Emergency Department (HOSPITAL_COMMUNITY)
Admission: EM | Admit: 2019-10-31 | Discharge: 2019-10-31 | Disposition: A | Discharge status: Home / Self Care | Payer: HRSA Program | Attending: Emergency Medicine | Admitting: Emergency Medicine

## 2019-10-31 ENCOUNTER — Other Ambulatory Visit: Payer: Self-pay

## 2019-10-31 DIAGNOSIS — Z87891 Personal history of nicotine dependence: Secondary | ICD-10-CM | POA: Diagnosis not present

## 2019-10-31 DIAGNOSIS — Z20822 Contact with and (suspected) exposure to covid-19: Secondary | ICD-10-CM | POA: Diagnosis not present

## 2019-10-31 LAB — SARS CORONAVIRUS 2 (TAT 6-24 HRS): SARS Coronavirus 2: NEGATIVE

## 2019-10-31 NOTE — ED Triage Notes (Signed)
Pt requesting a COVID test due to exposure to someone he lives with, denies any symptoms.

## 2019-11-04 NOTE — ED Provider Notes (Signed)
[facility] EMERGENCY DEPARTMENT Provider Note   CSN: [id number] Arrival date & time: 10/31/19  0730     History No chief complaint on file.   Todd Long is a 24 y.o. male.  HPI     24 year old male comes in a chief complaint of Covid exposure.  Patient reports that his spouse was diagnosed with COVID-19.  He has been around her.  He is not having any symptoms of COVID-19 at this time.  Particularly there is no nausea, vomiting, diarrhea, fevers, chills, chest pain, cough, shortness of breath.  Past Medical History:  Diagnosis Date  . Anxiety   . Asthma   . Depression   . PTSD (post-traumatic stress disorder)   . Suicidal behavior with attempted self-injury [facility])     Patient Active Problem List   Diagnosis Date Noted  . 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 TONSILLECTOMY         No family history on file.  Social History   Tobacco Use  . Smoking status: Former Games developer  . Smokeless tobacco: Never Used  Substance Use Topics  . Alcohol use: No  . Drug use: No    Home Medications Prior to Admission medications   Medication Sig Start Date End Date Taking? Authorizing Provider  famotidine (PEPCID) 20 MG tablet Take 1 tablet (20 mg total) by mouth 2 (two) times daily. 09/08/19   Khatri, Hina, PA-C  hydrOXYzine (ATARAX/VISTARIL) 25 MG tablet Take 1 tablet (25 mg total) by mouth 3 (three) times daily as needed for anxiety. 04/26/18   Money, Gerlene Burdock, FNP  prazosin (MINIPRESS) 1 MG capsule Take 1 capsule (1 mg total) by mouth at bedtime. For nightmares 04/26/18   Money, Gerlene Burdock, FNP  traZODone (DESYREL) 100 MG tablet Take 1 tablet (100 mg total) by mouth at bedtime as needed for sleep. 04/26/18   Money, Gerlene Burdock, FNP  venlafaxine XR (EFFEXOR-XR) 75 MG 24 hr capsule Take 1 capsule (75 mg total) by mouth daily with breakfast. For mood ocntrol 04/27/18    Money, Gerlene Burdock, FNP    Allergies    Patient has no known allergies.  Review of Systems   Review of Systems  Constitutional: Negative for activity change, chills and fever.  Eyes: Negative for visual disturbance.  Respiratory: Negative for cough, chest tightness and shortness of breath.   Cardiovascular: Negative for chest pain.  Gastrointestinal: Negative for abdominal distention, nausea and vomiting.  Genitourinary: Negative for difficulty urinating, dysuria and enuresis.  Musculoskeletal: Negative for myalgias, neck pain and neck stiffness.  Skin: Negative for rash.  Neurological: Positive for dizziness. Negative for light-headedness and headaches.  Hematological: Does not bruise/bleed easily.  Psychiatric/Behavioral: Negative for confusion.    Physical Exam Updated Vital Signs BP (!) 153/100 (BP Location: Right Wrist)   Pulse 90   Temp 98.5 F (36.9 C) (Oral)   Resp 18   Ht 5\' 9"  (1.753 m)   Wt (!) 173.7 kg   SpO2 96%   BMI 56.56 kg/m   Physical Exam Vitals and nursing note reviewed.  Constitutional:      Appearance: He is well-developed.  HENT:     Head: Atraumatic.  Cardiovascular:     Rate and Rhythm: Normal rate.  Pulmonary:     Effort: Pulmonary effort is normal.  Musculoskeletal:     Cervical back: Neck supple.  Skin:    General: Skin  is warm.  Neurological:     Mental Status: He is alert and oriented to person, place, and time.     ED Results / Procedures / Treatments   Labs (all labs ordered are listed, but only abnormal results are displayed) Labs Reviewed  SARS CORONAVIRUS 2 (TAT 6-24 HRS)    EKG None  Radiology No results found.  Procedures Procedures (including critical care time)  Medications Ordered in ED Medications - No data to display  ED Course  I have reviewed the triage vital signs and the nursing notes.  Pertinent labs & imaging results that were available during my care of the patient were reviewed by me and  considered in my medical decision making (see chart for details).    MDM Rules/Calculators/A&P                      24 year old asymptomatic patient comes in a chief complaint of Covid exposure.  His spouse was diagnosed with COVID-19.  We will provide appropriate work and also test him for COVID-19.  Final Clinical Impression(s) / ED Diagnoses Final diagnoses:  Close exposure to COVID-19 virus    Rx / DC Orders ED Discharge Orders    None       Varney Biles, MD 11/04/19 [phone]

## 2020-01-03 ENCOUNTER — Encounter (HOSPITAL_COMMUNITY): Payer: Self-pay | Admitting: Emergency Medicine

## 2020-01-03 ENCOUNTER — Emergency Department (HOSPITAL_COMMUNITY)
Admission: EM | Admit: 2020-01-03 | Discharge: 2020-01-03 | Disposition: A | Discharge status: Home / Self Care | Payer: Medicaid Other | Attending: Emergency Medicine | Admitting: Emergency Medicine

## 2020-01-03 DIAGNOSIS — J45909 Unspecified asthma, uncomplicated: Secondary | ICD-10-CM | POA: Insufficient documentation

## 2020-01-03 DIAGNOSIS — R1084 Generalized abdominal pain: Secondary | ICD-10-CM | POA: Insufficient documentation

## 2020-01-03 DIAGNOSIS — Z79899 Other long term (current) drug therapy: Secondary | ICD-10-CM | POA: Insufficient documentation

## 2020-01-03 DIAGNOSIS — Z87891 Personal history of nicotine dependence: Secondary | ICD-10-CM | POA: Insufficient documentation

## 2020-01-03 DIAGNOSIS — R112 Nausea with vomiting, unspecified: Secondary | ICD-10-CM | POA: Insufficient documentation

## 2020-01-03 DIAGNOSIS — R197 Diarrhea, unspecified: Secondary | ICD-10-CM | POA: Insufficient documentation

## 2020-01-03 LAB — COMPREHENSIVE METABOLIC PANEL
ALT: 24 U/L (ref 0–44)
AST: 19 U/L (ref 15–41)
Albumin: 3.5 g/dL (ref 3.5–5.0)
Alkaline Phosphatase: 35 U/L — ABNORMAL LOW (ref 38–126)
Anion gap: 9 (ref 5–15)
BUN: 13 mg/dL (ref 6–20)
CO2: 24 mmol/L (ref 22–32)
Calcium: 8.6 mg/dL — ABNORMAL LOW (ref 8.9–10.3)
Chloride: 104 mmol/L (ref 98–111)
Creatinine, Ser: 0.97 mg/dL (ref 0.61–1.24)
GFR calc Af Amer: >60 mL/min (ref >60)
GFR calc non Af Amer: >60 mL/min (ref >60)
Glucose, Bld: 99 mg/dL (ref 70–99)
Potassium: 3.9 mmol/L (ref 3.5–5.1)
Sodium: 137 mmol/L (ref 135–145)
Total Bilirubin: 0.3 mg/dL (ref 0.3–1.2)
Total Protein: 6.2 g/dL — ABNORMAL LOW (ref 6.5–8.1)

## 2020-01-03 LAB — CBC
HCT: 47.9 % (ref 39.0–52.0)
Hemoglobin: 15.4 g/dL (ref 13.0–17.0)
MCH: 29.8 pg (ref 26.0–34.0)
MCHC: 32.2 g/dL (ref 30.0–36.0)
MCV: 92.8 fL (ref 80.0–100.0)
Platelets: 294 10*3/uL (ref 150–400)
RBC: 5.16 MIL/uL (ref 4.22–5.81)
RDW: 12.9 % (ref 11.5–15.5)
WBC: 9.3 10*3/uL (ref 4.0–10.5)
nRBC: 0 % (ref 0.0–0.2)

## 2020-01-03 LAB — URINALYSIS, ROUTINE W REFLEX MICROSCOPIC
Bilirubin Urine: NEGATIVE
Glucose, UA: NEGATIVE mg/dL
Hgb urine dipstick: NEGATIVE
Ketones, ur: NEGATIVE mg/dL
Leukocytes,Ua: NEGATIVE
Nitrite: NEGATIVE
Protein, ur: NEGATIVE mg/dL
Specific Gravity, Urine: 1.021 (ref 1.005–1.030)
pH: 6 (ref 5.0–8.0)

## 2020-01-03 LAB — LIPASE, BLOOD: Lipase: 26 U/L (ref 11–51)

## 2020-01-03 MED ORDER — DICYCLOMINE HCL 10 MG PO CAPS
20.0000 mg | ORAL_CAPSULE | Freq: Once | ORAL | Status: AC
  Administered 2020-01-03: 20 mg via ORAL
  Filled 2020-01-03: qty 2

## 2020-01-03 MED ORDER — SODIUM CHLORIDE 0.9% FLUSH: 3.0000 mL | Freq: Once | INTRAVENOUS | Status: DC

## 2020-01-03 MED ORDER — ONDANSETRON HCL 4 MG/2ML IJ SOLN
4.0000 mg | Freq: Once | INTRAMUSCULAR | Status: AC
  Administered 2020-01-03: 4 mg via INTRAVENOUS
  Filled 2020-01-03: qty 2

## 2020-01-03 MED ORDER — DICYCLOMINE HCL 20 MG PO TABS: 20.0000 mg | ORAL_TABLET | Freq: Two times a day (BID) | ORAL | 0 refills | Status: DC

## 2020-01-03 MED ORDER — SODIUM CHLORIDE 0.9 % IV BOLUS
1000.0000 mL | Freq: Once | INTRAVENOUS | Status: AC
  Administered 2020-01-03: 1000 mL via INTRAVENOUS

## 2020-01-03 MED ORDER — ONDANSETRON 8 MG PO TBDP: 8.0000 mg | ORAL_TABLET | Freq: Three times a day (TID) | ORAL | 0 refills | Status: DC | PRN

## 2020-01-03 NOTE — ED Notes (Signed)
Pt reports he ate the salad about 9PM and around midnight he started having diarrhea and vomiting. Pt report to many episodes to count.

## 2020-01-03 NOTE — ED Provider Notes (Signed)
[facility] EMERGENCY DEPARTMENT Provider Note   CSN: [id number] Arrival date & time: 01/03/20  6578     History Chief Complaint  Patient presents with  . Abdominal Pain    Todd Long is a 24 y.o. male.  HPI     Todd Long is a 24 y.o. male, with a history of anxiety, depression, asthma, obesity, presenting to the ED with abdominal pain, nausea, vomiting, and diarrhea. Patient states his symptoms started around midnight this morning.  He has had several episodes of nonbloody, nonbilious emesis. His abdominal pain is sharp, primarily in the lower abdomen, constant, radiating throughout the abdomen, currently 10/10.  Denies any abdominal surgeries. Denies fever, hematochezia/melena, urinary symptoms, chest pain, cough, shortness of breath, flank/back pain, genital pain/swelling, or any other complaints.  Past Medical History:  Diagnosis Date  . Anxiety   . Asthma   . Depression   . PTSD (post-traumatic stress disorder)   . Suicidal behavior with attempted self-injury [facility])     Patient Active Problem List   Diagnosis Date Noted  . 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 TONSILLECTOMY         No family history on file.  Social History   Tobacco Use  . Smoking status: Former Games developer  . Smokeless tobacco: Never Used  Substance Use Topics  . Alcohol use: No  . Drug use: No    Home Medications Prior to Admission medications   Medication Sig Start Date End Date Taking? Authorizing Provider  dicyclomine (BENTYL) 20 MG tablet Take 1 tablet (20 mg total) by mouth 2 (two) times daily. 01/03/20   Eiman Maret C, PA-C  famotidine (PEPCID) 20 MG tablet Take 1 tablet (20 mg total) by mouth 2 (two) times daily. 09/08/19   Khatri, Hina, PA-C  hydrOXYzine (ATARAX/VISTARIL) 25 MG tablet Take 1 tablet (25 mg total) by mouth 3 (three) times daily as needed  for anxiety. 04/26/18   Money, Gerlene Burdock, FNP  ondansetron (ZOFRAN ODT) 8 MG disintegrating tablet Take 1 tablet (8 mg total) by mouth every 8 (eight) hours as needed for nausea or vomiting. 01/03/20   Mendy Chou C, PA-C  prazosin (MINIPRESS) 1 MG capsule Take 1 capsule (1 mg total) by mouth at bedtime. For nightmares 04/26/18   Money, Gerlene Burdock, FNP  traZODone (DESYREL) 100 MG tablet Take 1 tablet (100 mg total) by mouth at bedtime as needed for sleep. 04/26/18   Money, Gerlene Burdock, FNP  venlafaxine XR (EFFEXOR-XR) 75 MG 24 hr capsule Take 1 capsule (75 mg total) by mouth daily with breakfast. For mood ocntrol 04/27/18   Money, Gerlene Burdock, FNP    Allergies    Patient has no known allergies.  Review of Systems   Review of Systems  Constitutional: Negative for chills and fever.  Respiratory: Negative for cough and shortness of breath.   Cardiovascular: Negative for chest pain.  Gastrointestinal: Positive for abdominal pain, diarrhea, nausea and vomiting. Negative for blood in stool.  Genitourinary: Negative for dysuria, flank pain, frequency and hematuria.  Musculoskeletal: Negative for back pain.  Neurological: Negative for syncope.  All other systems reviewed and are negative.   Physical Exam Updated Vital Signs BP 128/76   Pulse 93   Temp 98.9 F (37.2 C) (Oral)   Resp 16   Ht 5\' 10"  (1.778 m)   Wt (!) 176.4 kg   SpO2 97%  BMI 55.82 kg/m   Physical Exam Vitals and nursing note reviewed.  Constitutional:      General: He is not in acute distress.    Appearance: He is well-developed. He is obese. He is not diaphoretic.  HENT:     Head: Normocephalic and atraumatic.     Mouth/Throat:     Mouth: Mucous membranes are moist.     Pharynx: Oropharynx is clear.  Eyes:     Conjunctiva/sclera: Conjunctivae normal.  Cardiovascular:     Rate and Rhythm: Normal rate and regular rhythm.     Pulses: Normal pulses.          Radial pulses are 2+ on the right side and 2+ on the left side.    Pulmonary:     Effort: Pulmonary effort is normal. No respiratory distress.  Abdominal:     Palpations: Abdomen is soft.     Tenderness: There is generalized abdominal tenderness. There is no guarding.     Comments: Though patient indicates generalized tenderness, it seems to be worse in the lower abdomen. Of specific concern is tenderness in the right lower quadrant.  Musculoskeletal:     Cervical back: Neck supple.  Lymphadenopathy:     Cervical: No cervical adenopathy.  Skin:    General: Skin is warm and dry.  Neurological:     Mental Status: He is alert.  Psychiatric:        Mood and Affect: Mood and affect normal.        Speech: Speech normal.        Behavior: Behavior normal.     ED Results / Procedures / Treatments   Labs (all labs ordered are listed, but only abnormal results are displayed) Labs Reviewed  COMPREHENSIVE METABOLIC PANEL - Abnormal; Notable for the following components:      Result Value   Calcium 8.6 (*)    Total Protein 6.2 (*)    Alkaline Phosphatase 35 (*)    All other components within normal limits  LIPASE, BLOOD  CBC  URINALYSIS, ROUTINE W REFLEX MICROSCOPIC    EKG None  Radiology No results found.  Procedures Procedures (including critical care time)  Medications Ordered in ED Medications  sodium chloride flush (NS) 0.9 % injection 3 mL (has no administration in time range)  sodium chloride 0.9 % bolus 1,000 mL (0 mLs Intravenous Stopped 01/03/20 1738)  ondansetron (ZOFRAN) injection 4 mg (4 mg Intravenous Given 01/03/20 1523)  dicyclomine (BENTYL) capsule 20 mg (20 mg Oral Given 01/03/20 1523)    ED Course  I have reviewed the triage vital signs and the nursing notes.  Pertinent labs & imaging results that were available during my care of the patient were reviewed by me and considered in my medical decision making (see chart for details).  Clinical Course as of Jan 02 1845  Fri Jan 03, 2020  1709 Patient states he needs to  leave to pick up his child.  His pain has greatly improved.  I reexamined him and he notes his pain is more central around the umbilicus.  No noted tenderness on repeat exam.   [SJ]    Clinical Course User Index [SJ] Carmelia Tiner, Helane Gunther, PA-C   MDM Rules/Calculators/A&P                      Patient presents with abdominal pain, nausea, vomiting, and diarrhea. Patient is nontoxic appearing, afebrile, not tachycardic, not tachypneic, not hypotensive, maintains excellent SPO2 on room air,  and is in no apparent distress.  Although sudden onset of pain, vomiting, and diarrhea would not be the classic presentation for surgical conditions, such as appendicitis, due to his definite, focal tenderness in the lower abdomen and especially the right lower quadrant, further investigation is necessary.  I have reviewed the patient's chart to obtain more information.   I reviewed and interpreted the patient's labs.  No leukocytosis.  Lab work overall reassuring. Repeat abdominal exam benign.  Patient needed to leave before the work-up was complete. The patient was given instructions for home care as well as strict return precautions. Patient voices understanding of these instructions, accepts the plan, and is comfortable with discharge.   Vitals:   01/03/20 0954 01/03/20 1629 01/03/20 1734  BP: 128/76 127/85 (!) 151/106  Pulse: 93 75 62  Resp: 16 15 16   Temp: 98.9 F (37.2 C) 98.1 F (36.7 C) 98.5 F (36.9 C)  TempSrc: Oral Oral   SpO2: 97% 100% 96%  Weight: (!) 176.4 kg    Height: 5\' 10"  (1.778 m)       Final Clinical Impression(s) / ED Diagnoses Final diagnoses:  Generalized abdominal pain  Nausea vomiting and diarrhea    Rx / DC Orders ED Discharge Orders         Ordered    dicyclomine (BENTYL) 20 MG tablet  2 times daily     01/03/20 1718    ondansetron (ZOFRAN ODT) 8 MG disintegrating tablet  Every 8 hours PRN     01/03/20 1718           01/05/20, PA-C 01/03/20 Anselm Pancoast, MD 01/04/20 [phone]

## 2020-01-03 NOTE — ED Triage Notes (Signed)
Pt states he ate a caesar salad prior to going to bed and woke up with generalized abd cramps and N/V/D

## 2020-01-03 NOTE — Discharge Instructions (Signed)
Nausea, Vomiting, and Diarrhea  Hand washing: Wash your hands throughout the day, but especially before and after touching the face, using the restroom, sneezing, coughing, or touching surfaces that have been coughed or sneezed upon. Hydration: Symptoms will be intensified and complicated by dehydration. Dehydration can also extend the duration of symptoms. Drink plenty of fluids and get plenty of rest. You should be drinking at least half a liter of water an hour to stay hydrated. Electrolyte drinks (ex. Gatorade, Powerade, Pedialyte) are also encouraged. You should be drinking enough fluids to make your urine light yellow, almost clear. If this is not the case, you are not drinking enough water. Please note that some of the treatments indicated below will not be effective if you are not adequately hydrated. Diet: Please concentrate on hydration, however, you may introduce food slowly.  Start with a clear liquid diet, progressed to a full liquid diet, and then bland solids as you are able. Pain or fever: Ibuprofen, Naproxen, or Tylenol for pain or fever.  Nausea/vomiting: Use the ondansetron (generic for Zofran) for nausea or vomiting.  This medication may not prevent all vomiting or nausea, but can help facilitate better hydration. Things that can help with nausea/vomiting also include peppermint/menthol candies, vitamin B12, and ginger. Diarrhea: May use medications such as loperamide (Imodium) or Bismuth subsalicylate (Pepto-Bismol). Bentyl: This medication is what is known as an antispasmodic and is intended to help reduce abdominal discomfort. Follow-up: Follow-up with a primary care provider on this matter. Return: Return should you develop a fever, bloody diarrhea, increased abdominal pain, uncontrolled vomiting, or any other major concerns.  For prescription assistance, may try using prescription discount sites or apps, such as goodrx.com 

## 2020-09-02 ENCOUNTER — Encounter (HOSPITAL_COMMUNITY): Payer: Self-pay

## 2020-09-02 ENCOUNTER — Emergency Department (HOSPITAL_COMMUNITY): Payer: Medicaid Other

## 2020-09-02 ENCOUNTER — Emergency Department (HOSPITAL_COMMUNITY)
Admission: EM | Admit: 2020-09-02 | Discharge: 2020-09-02 | Disposition: A | Discharge status: Home / Self Care | Payer: Medicaid Other | Attending: Emergency Medicine | Admitting: Emergency Medicine

## 2020-09-02 ENCOUNTER — Other Ambulatory Visit: Payer: Self-pay

## 2020-09-02 DIAGNOSIS — M25512 Pain in left shoulder: Secondary | ICD-10-CM | POA: Insufficient documentation

## 2020-09-02 DIAGNOSIS — J45909 Unspecified asthma, uncomplicated: Secondary | ICD-10-CM | POA: Insufficient documentation

## 2020-09-02 DIAGNOSIS — Z87891 Personal history of nicotine dependence: Secondary | ICD-10-CM | POA: Insufficient documentation

## 2020-09-02 DIAGNOSIS — X500XXA Overexertion from strenuous movement or load, initial encounter: Secondary | ICD-10-CM | POA: Insufficient documentation

## 2020-09-02 MED ORDER — KETOROLAC TROMETHAMINE 60 MG/2ML IM SOLN
15.0000 mg | Freq: Once | INTRAMUSCULAR | Status: AC
  Administered 2020-09-02: 15 mg via INTRAMUSCULAR
  Filled 2020-09-02: qty 2

## 2020-09-02 NOTE — Discharge Instructions (Signed)
Take 4 over the counter ibuprofen tablets 3 times a day or 2 over-the-counter naproxen tablets twice a day for pain. Also take tylenol 1000mg (2 extra strength) four times a day.     The sling is for your comfort.  You do need to take your arm out of the sling at least 4 times a day and perform range of motion exercises with your shoulder.  Follow-up with sports medicine in the office.

## 2020-09-02 NOTE — ED Provider Notes (Signed)
[facility] EMERGENCY DEPARTMENT Provider Note   CSN: [id number] Arrival date & time: 09/02/20  1029     History Chief Complaint  Patient presents with  . Shoulder Pain    Todd Long is a 25 y.o. male.  25 yo M with a chief complaint of left shoulder pain.  The patient was lifting heavy boxes yesterday and felt a pop to the shoulder.  Since then any motion tends to make it worse.  Has not tried any medications at home to help.  Had trouble sleeping on it last night.  No traumatic injury.  The history is provided by the patient.  Shoulder Pain Location:  Shoulder Shoulder location:  L shoulder Injury: yes   Time since incident:  1 day Mechanism of injury comment:  Heavy lifting Pain details:    Quality:  Sharp and shooting   Radiates to:  Does not radiate   Severity:  Moderate   Onset quality:  Gradual   Duration:  1 day   Timing:  Constant   Progression:  Worsening Handedness:  Right-handed Dislocation: no   Prior injury to area:  No Relieved by:  Nothing Worsened by:  Bearing weight, exercise and movement Ineffective treatments:  None tried Associated symptoms: no fever        Past Medical History:  Diagnosis Date  . Anxiety   . Asthma   . Depression   . PTSD (post-traumatic stress disorder)   . Suicidal behavior with attempted self-injury [facility])     Patient Active Problem List   Diagnosis Date Noted  . 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 TONSILLECTOMY         No family history on file.  Social History   Tobacco Use  . Smoking status: Former Games developer  . Smokeless tobacco: Never Used  Substance Use Topics  . Alcohol use: No  . Drug use: No    Home Medications Prior to Admission medications   Medication Sig Start Date End Date Taking? Authorizing Provider  dicyclomine (BENTYL) 20 MG tablet Take 1 tablet (20 mg total) by  mouth 2 (two) times daily. 01/03/20   Joy, Shawn C, PA-C  famotidine (PEPCID) 20 MG tablet Take 1 tablet (20 mg total) by mouth 2 (two) times daily. 09/08/19   Khatri, Hina, PA-C  hydrOXYzine (ATARAX/VISTARIL) 25 MG tablet Take 1 tablet (25 mg total) by mouth 3 (three) times daily as needed for anxiety. 04/26/18   Money, Gerlene Burdock, FNP  ondansetron (ZOFRAN ODT) 8 MG disintegrating tablet Take 1 tablet (8 mg total) by mouth every 8 (eight) hours as needed for nausea or vomiting. 01/03/20   Joy, Shawn C, PA-C  prazosin (MINIPRESS) 1 MG capsule Take 1 capsule (1 mg total) by mouth at bedtime. For nightmares 04/26/18   Money, Gerlene Burdock, FNP  traZODone (DESYREL) 100 MG tablet Take 1 tablet (100 mg total) by mouth at bedtime as needed for sleep. 04/26/18   Money, Gerlene Burdock, FNP  venlafaxine XR (EFFEXOR-XR) 75 MG 24 hr capsule Take 1 capsule (75 mg total) by mouth daily with breakfast. For mood ocntrol 04/27/18   Money, Gerlene Burdock, FNP    Allergies    Patient has no known allergies.  Review of Systems   Review of Systems  Constitutional: Negative for chills and fever.  HENT: Negative for congestion and facial swelling.   Eyes: Negative for discharge and visual  disturbance.  Respiratory: Negative for shortness of breath.   Cardiovascular: Negative for chest pain and palpitations.  Gastrointestinal: Negative for abdominal pain, diarrhea and vomiting.  Musculoskeletal: Positive for arthralgias. Negative for myalgias.  Skin: Negative for color change and rash.  Neurological: Negative for tremors, syncope and headaches.  Psychiatric/Behavioral: Negative for confusion and dysphoric mood.    Physical Exam Updated Vital Signs BP 122/78 (BP Location: Right Wrist)   Pulse 75   Temp 98.2 F (36.8 C) (Oral)   Resp 16   Ht 5\' 9"  (1.753 m)   Wt (!) 176.9 kg   SpO2 100%   BMI 57.59 kg/m   Physical Exam Vitals and nursing note reviewed.  Constitutional:      Appearance: He is well-developed and  well-nourished.     Comments: BMI >57  HENT:     Head: Normocephalic and atraumatic.  Eyes:     Extraocular Movements: EOM normal.     Pupils: Pupils are equal, round, and reactive to light.  Neck:     Vascular: No JVD.  Cardiovascular:     Rate and Rhythm: Normal rate and regular rhythm.     Heart sounds: No murmur heard. No friction rub. No gallop.   Pulmonary:     Effort: No respiratory distress.     Breath sounds: No wheezing.  Abdominal:     General: There is no distension.     Tenderness: There is no guarding or rebound.  Musculoskeletal:        General: Tenderness present. Normal range of motion.     Cervical back: Normal range of motion and neck supple.     Comments: Diffuse pain about the shoulder, worse to the anterior aspect along the clavicle and AC joint.  Pain to the biceps tendon as well.  Difficult to assess range of motion secondary to pain.  Difficulty to assess the rotator cuff due to the same.  Pulse motor and sensation intact distally.  Full range of motion of the elbow and the wrist.  Skin:    Coloration: Skin is not pale.     Findings: No rash.  Neurological:     Mental Status: He is alert and oriented to person, place, and time.  Psychiatric:        Mood and Affect: Mood and affect normal.        Behavior: Behavior normal.     ED Results / Procedures / Treatments   Labs (all labs ordered are listed, but only abnormal results are displayed) Labs Reviewed - No data to display  EKG None  Radiology DG Shoulder Left  Result Date: 09/02/2020 CLINICAL DATA:  Left shoulder pain after injury 1 day ago EXAM: LEFT SHOULDER - 2+ VIEW COMPARISON:  None. FINDINGS: There is no evidence of fracture or dislocation. There is no evidence of arthropathy or other focal bone abnormality. Soft tissues are unremarkable. IMPRESSION: Negative. Electronically Signed   By: 09/04/2020 D.O.   On: 09/02/2020 11:26    Procedures Procedures (including critical care  time)  Medications Ordered in ED Medications  ketorolac (TORADOL) injection 15 mg (15 mg Intramuscular Given 09/02/20 1155)    ED Course  I have reviewed the triage vital signs and the nursing notes.  Pertinent labs & imaging results that were available during my care of the patient were reviewed by me and considered in my medical decision making (see chart for details).    MDM Rules/Calculators/A&P  25 yo M with a chief complaint of left shoulder pain.  Nontraumatic.  Plain film viewed by me without fracture or dislocation.  Difficulty performing exam secondary to pain.  Will place in a sling.  Sports medicine follow-up.  12:04 PM:  I have discussed the diagnosis/risks/treatment options with the patient and believe the pt to be eligible for discharge home to follow-up with PCP, sports med. We also discussed returning to the ED immediately if new or worsening sx occur. We discussed the sx which are most concerning (e.g., sudden worsening pain, fever, inability to tolerate by mouth) that necessitate immediate return. Medications administered to the patient during their visit and any new prescriptions provided to the patient are listed below.  Medications given during this visit Medications  ketorolac (TORADOL) injection 15 mg (15 mg Intramuscular Given 09/02/20 1155)     The patient appears reasonably screen and/or stabilized for discharge and I doubt any other medical condition or other [facility] requiring further screening, evaluation, or treatment in the ED at this time prior to discharge.   Final Clinical Impression(s) / ED Diagnoses Final diagnoses:  Acute pain of left shoulder    Rx / DC Orders ED Discharge Orders    None       Melene Plan, DO 09/02/20 1204

## 2020-09-02 NOTE — ED Triage Notes (Signed)
Pt reports he was at work yesterday lifting heavy boxes when he heard a pop to his left shoulder.

## 2021-07-05 IMAGING — CR DG SHOULDER 2+V*L*
3 series · 3 of 3 positions shown · non-contrast
Comparison: None.

CLINICAL DATA: Left shoulder pain after injury 1 day ago

EXAM:
LEFT SHOULDER - 2+ VIEW

[shoulder grashey]
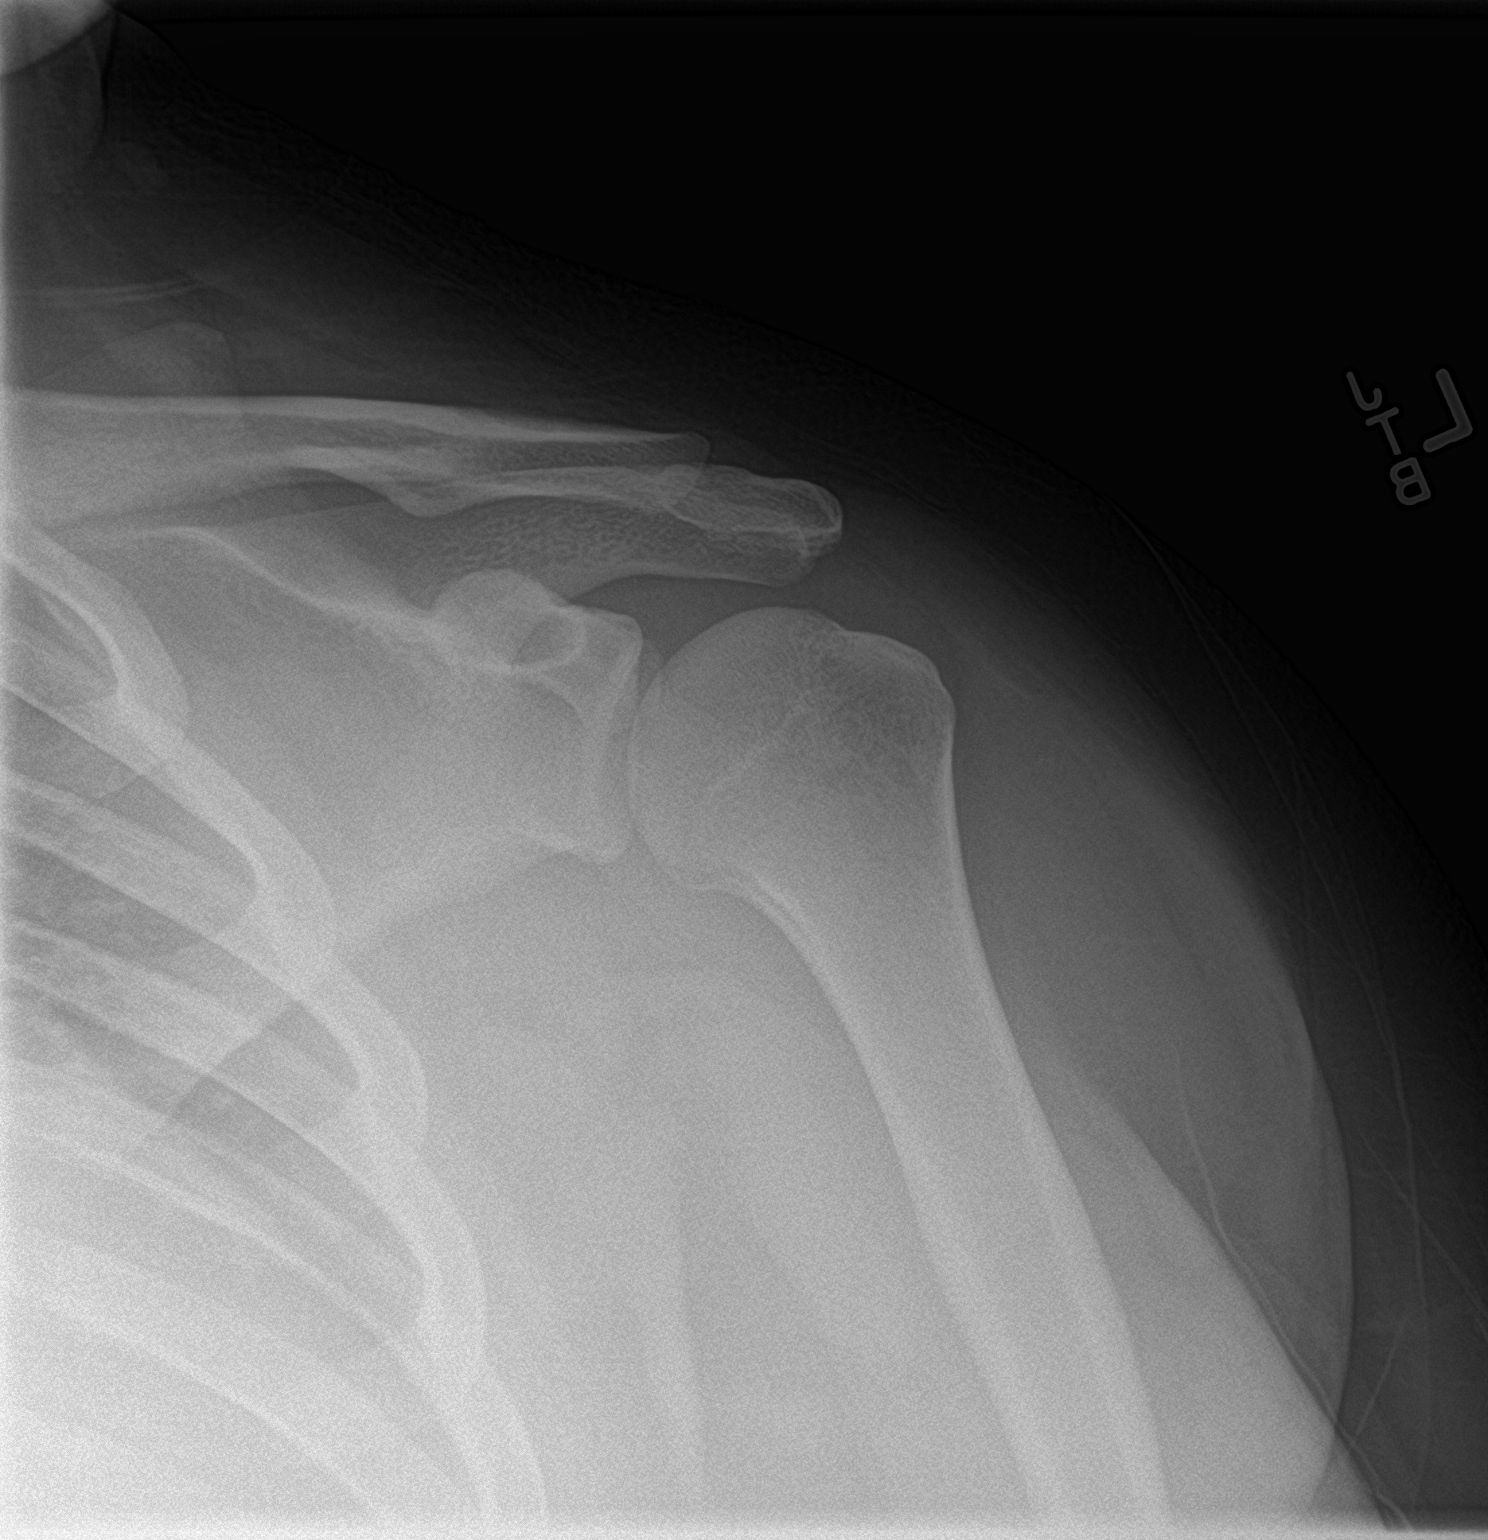

[shoulder y view]
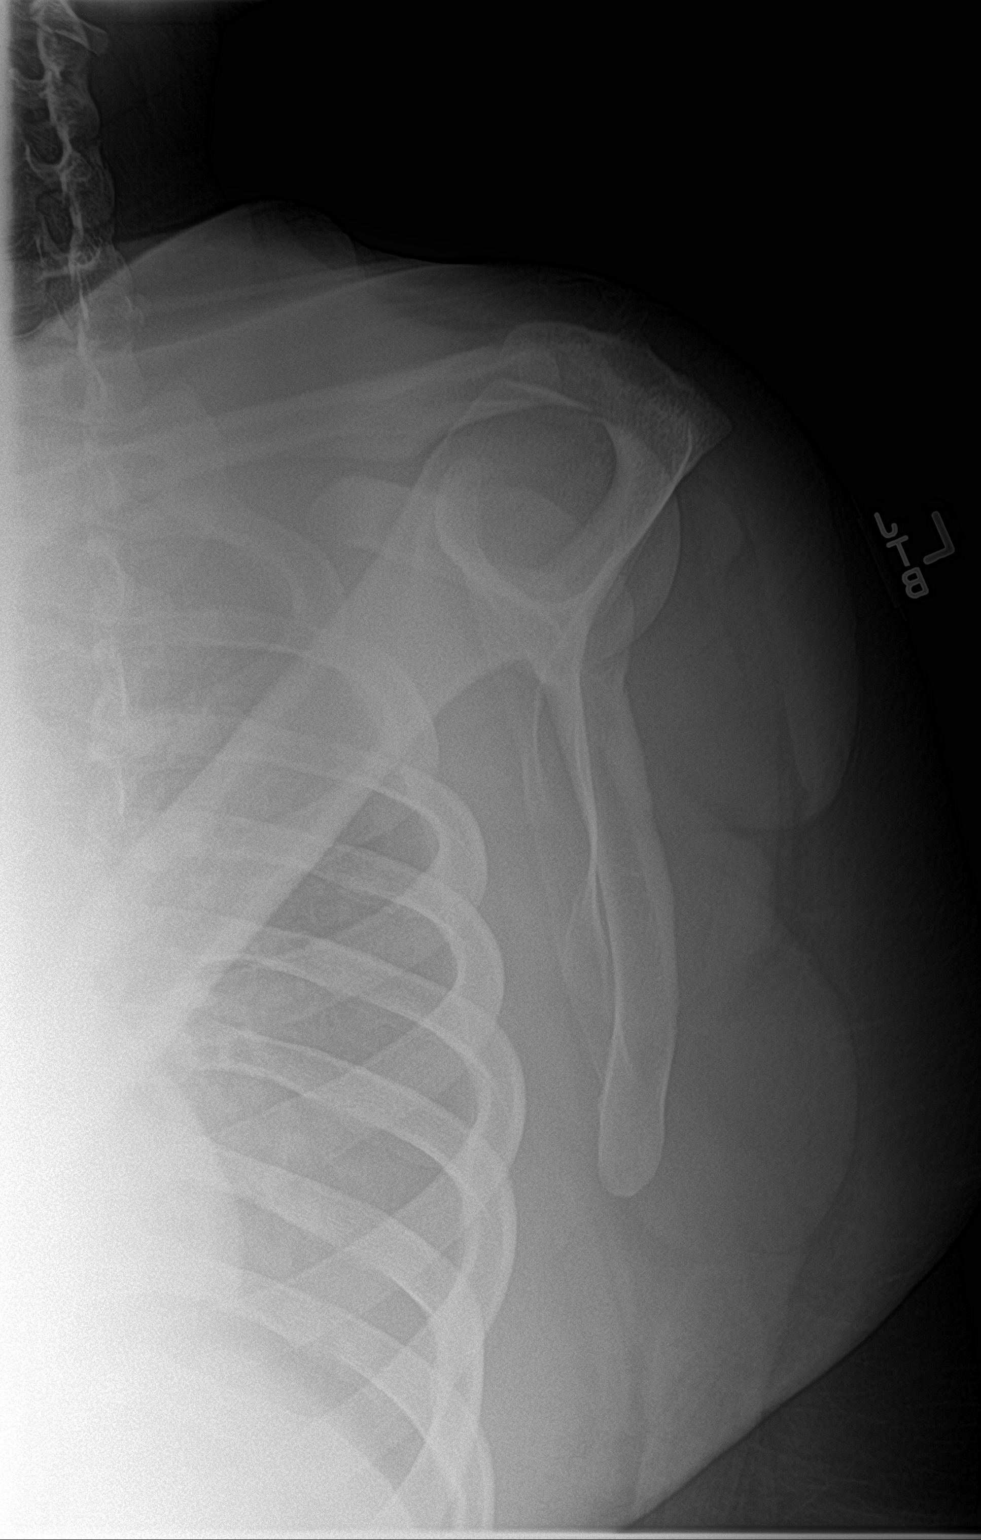

[shoulder axillary]
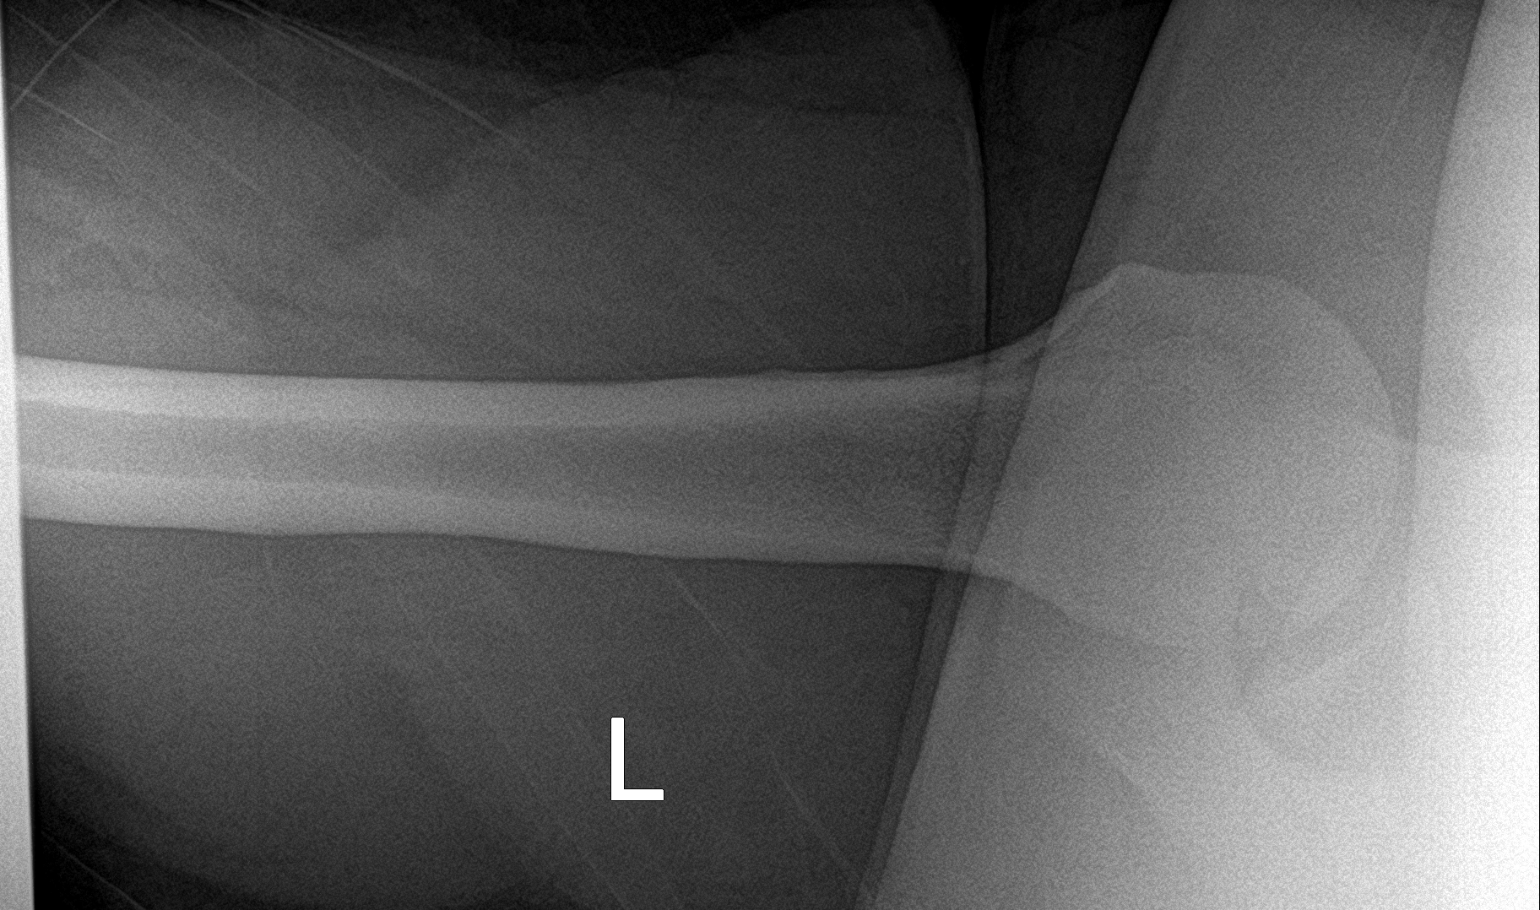

[3 of 3 positions shown; findings below may reference images not displayed]

FINDINGS: There is no evidence of fracture or dislocation. There is no
evidence of arthropathy or other focal bone abnormality. Soft
tissues are unremarkable.
IMPRESSION: Negative.

## 2021-12-01 ENCOUNTER — Emergency Department (HOSPITAL_COMMUNITY)
Admission: EM | Admit: 2021-12-01 | Discharge: 2021-12-01 | Discharge status: Against Medical Advice | Payer: Medicaid Other

## 2021-12-01 NOTE — ED Notes (Signed)
Pt called for triage x2 with no response.  

## 2021-12-01 NOTE — ED Notes (Signed)
Pt called for vitals with no response. 

## 2022-06-06 ENCOUNTER — Ambulatory Visit
Admission: EM | Admit: 2022-06-06 | Discharge: 2022-06-06 | Disposition: A | Discharge status: Home / Self Care | Payer: 59 | Attending: Internal Medicine | Admitting: Internal Medicine

## 2022-06-06 DIAGNOSIS — Z1152 Encounter for screening for COVID-19: Secondary | ICD-10-CM | POA: Insufficient documentation

## 2022-06-06 DIAGNOSIS — J029 Acute pharyngitis, unspecified: Secondary | ICD-10-CM

## 2022-06-06 LAB — SARS CORONAVIRUS 2 BY RT PCR: SARS Coronavirus 2 by RT PCR: NEGATIVE

## 2022-06-06 MED ORDER — IBUPROFEN 600 MG PO TABS: 600.0000 mg | ORAL_TABLET | Freq: Four times a day (QID) | ORAL | 0 refills | Status: DC | PRN

## 2022-06-06 MED ORDER — LIDOCAINE VISCOUS HCL 2 % MT SOLN: 15.0000 mL | Freq: Four times a day (QID) | OROMUCOSAL | 0 refills | Status: DC | PRN

## 2022-06-06 MED ORDER — BENZONATATE 200 MG PO CAPS: 200.0000 mg | ORAL_CAPSULE | Freq: Three times a day (TID) | ORAL | 0 refills | Status: DC | PRN

## 2022-06-06 NOTE — Discharge Instructions (Addendum)
Please maintain adequate hydration Please continue until lab results are available Please take medications as prescribed We will call you with recommendations if labs are abnormal Return to urgent care if symptoms worsen.

## 2022-06-06 NOTE — ED Triage Notes (Signed)
Pt presents with productive cough X 2 days.  

## 2022-06-07 NOTE — ED Provider Notes (Signed)
[facility]    CSN: [id number] Arrival date & time: 06/06/22  0945      History   Chief Complaint Chief Complaint  Patient presents with   Cough    HPI Todd Long is a 26 y.o. male comes to the urgent care with a 2-day history of chesty cough productive of slightly yellowish sputum.  Patient says symptoms started fairly rapidly and has been persistent.  He denies any shortness of breath or wheezing.  No sick contacts.  No nausea vomiting or diarrhea.  No chest tightness or wheezing.  Patient has a sore throat.  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 Authorizing Provider  benzonatate (TESSALON) 200 MG capsule Take 1 capsule (200 mg total) by mouth 3 (three) times daily as needed for cough. 06/06/22  Yes Ocie Stanzione, Britta Mccreedy, MD  ibuprofen (ADVIL) 600 MG tablet Take 1 tablet (600 mg total) by mouth every 6 (six) hours as needed. 06/06/22  Yes Tymira Horkey, Britta Mccreedy, MD  magic mouthwash (lidocaine, diphenhydrAMINE, alum & mag hydroxide) suspension Swish and swallow 15 mLs 4 (four) times daily as needed for mouth pain. Compounding formula: Maalox-80 mL, viscous lidocaine 2%-80 mL, Benadryl 12.5 mg/ML-80 mg. 06/06/22  Yes Filmore Molyneux, Britta Mccreedy, MD  dicyclomine (BENTYL) 20 MG tablet Take 1 tablet (20 mg total) by mouth 2 (two) times daily. 01/03/20   Joy, Shawn C, PA-C  famotidine (PEPCID) 20 MG tablet Take 1 tablet (20 mg total) by mouth 2 (two) times daily. 09/08/19   Khatri, Hina, PA-C  hydrOXYzine  (ATARAX/VISTARIL) 25 MG tablet Take 1 tablet (25 mg total) by mouth 3 (three) times daily as needed for anxiety. 04/26/18   Money, Gerlene Burdock, FNP  ondansetron (ZOFRAN ODT) 8 MG disintegrating tablet Take 1 tablet (8 mg total) by mouth every 8 (eight) hours as needed for nausea or vomiting. 01/03/20   Joy, Shawn C, PA-C  prazosin (MINIPRESS) 1 MG capsule Take 1 capsule (1 mg total) by mouth at bedtime. For nightmares 04/26/18   Money, Gerlene Burdock, FNP  traZODone (DESYREL) 100 MG tablet Take 1 tablet (100 mg total) by mouth at bedtime as needed for sleep. 04/26/18   Money, Gerlene Burdock, FNP  venlafaxine XR (EFFEXOR-XR) 75 MG 24 hr capsule Take 1 capsule (75 mg total) by mouth daily with breakfast. For mood ocntrol 04/27/18   Money, Gerlene Burdock, FNP    Family History Family History  Family history unknown: Yes    Social History Social History   Tobacco Use   Smoking status: Former   Smokeless tobacco: Never  Substance Use Topics   Alcohol use: No   Drug use: No     Allergies   Patient has no known allergies.   Review of Systems Review of Systems As per HPI  Physical Exam Triage Vital Signs ED Triage Vitals  Enc Vitals Group     BP 06/06/22 1127 (!) 161/120  Pulse Rate 06/06/22 1127 62     Resp 06/06/22 1127 18     Temp 06/06/22 1127 98.1 F (36.7 C)     Temp Source 06/06/22 1127 Oral     SpO2 06/06/22 1127 97 %     Weight --      Height --      Head Circumference --      Peak Flow --      Pain Score 06/06/22 1126 2     Pain Loc --      Pain Edu? --      Excl. in [place]? --    No data found.  Updated Vital Signs BP (!) 161/120 (BP Location: Left Arm)   Pulse 62   Temp 98.1 F (36.7 C) (Oral)   Resp 18   SpO2 97%   Visual Acuity Right Eye Distance:   Left Eye Distance:   Bilateral Distance:    Right Eye Near:   Left Eye Near:    Bilateral Near:     Physical Exam Vitals and nursing note reviewed.  HENT:     Right Ear: Tympanic membrane normal.     Left Ear:  Tympanic membrane normal.     Mouth/Throat:     Mouth: Mucous membranes are moist.     Pharynx: Posterior oropharyngeal erythema present.  Eyes:     Pupils: Pupils are equal, round, and reactive to light.  Cardiovascular:     Rate and Rhythm: Normal rate and regular rhythm.     Pulses: Normal pulses.     Heart sounds: Normal heart sounds.  Pulmonary:     Effort: Pulmonary effort is normal.     Breath sounds: Normal breath sounds.  Abdominal:     General: Bowel sounds are normal.     Palpations: Abdomen is soft.      UC Treatments / Results  Labs (all labs ordered are listed, but only abnormal results are displayed) Labs Reviewed  SARS CORONAVIRUS 2 BY RT PCR    EKG   Radiology No results found.  Procedures Procedures (including critical care time)  Medications Ordered in UC Medications - No data to display  Initial Impression / Assessment and Plan / UC Course  I have reviewed the triage vital signs and the nursing notes.  Pertinent labs & imaging results that were available during my care of the patient were reviewed by me and considered in my medical decision making (see chart for details).     1.  Acute viral pharyngitis: COVID-19 PCR test has been sent Maintain adequate hydration Ibuprofen as needed for generalized body aches Tessalon Perles as needed for cough Magic mouthwash to be gargled for throat pain. Return precautions given. Final Clinical Impressions(s) / UC Diagnoses   Final diagnoses:  Viral pharyngitis     Discharge Instructions      Please maintain adequate hydration Please continue until lab results are available Please take medications as prescribed We will call you with recommendations if labs are abnormal Return to urgent care if symptoms worsen.   ED Prescriptions     Medication Sig Dispense Auth. Provider   ibuprofen (ADVIL) 600 MG tablet Take 1 tablet (600 mg total) by mouth every 6 (six) hours as needed. 30 tablet  Jazman Reuter, Myrene Galas, MD   benzonatate (TESSALON) 200 MG capsule Take 1 capsule (200 mg total) by mouth 3 (three) times daily as needed for cough. 30 capsule Govani Radloff, Myrene Galas, MD   magic mouthwash (lidocaine, diphenhydrAMINE, alum &  mag hydroxide) suspension Swish and swallow 15 mLs 4 (four) times daily as needed for mouth pain. Compounding formula: Maalox-80 mL, viscous lidocaine 2%-80 mL, Benadryl 12.5 mg/ML-80 mg. 240 mL Evangelina Delancey, Britta Mccreedy, MD      PDMP not reviewed this encounter.   Merrilee Jansky, MD 06/07/22 [phone]

## 2022-12-27 ENCOUNTER — Ambulatory Visit
Admission: EM | Admit: 2022-12-27 | Discharge: 2022-12-27 | Disposition: A | Discharge status: Home / Self Care | Payer: 59 | Attending: Nurse Practitioner | Admitting: Nurse Practitioner

## 2022-12-27 DIAGNOSIS — Z1152 Encounter for screening for COVID-19: Secondary | ICD-10-CM

## 2022-12-27 DIAGNOSIS — J069 Acute upper respiratory infection, unspecified: Secondary | ICD-10-CM | POA: Insufficient documentation

## 2022-12-27 DIAGNOSIS — J029 Acute pharyngitis, unspecified: Secondary | ICD-10-CM | POA: Diagnosis present

## 2022-12-27 LAB — POCT RAPID STREP A (OFFICE): Rapid Strep A Screen: NEGATIVE

## 2022-12-27 MED ORDER — BENZONATATE 100 MG PO CAPS: 100.0000 mg | ORAL_CAPSULE | Freq: Three times a day (TID) | ORAL | 0 refills | Status: DC | PRN

## 2022-12-27 NOTE — ED Provider Notes (Signed)
[facility]    CSN: [id number] Arrival date & time: 12/27/22  1716      History   Chief Complaint No chief complaint on file.   HPI Todd Long is a 27 y.o. male.   Patient presents today with 2-day history of bodyaches, congested cough, chest pain with coughing, runny and stuffy nose, sore throat when he coughs, headache when he coughs, diarrhea, decreased appetite, and fatigue.  He denies known fever, chills, shortness of breath, ear pain, abdominal pain, nausea/vomiting, and new rash.  No known sick contacts.  Has not taken anything for symptoms so far.    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 Authorizing Provider  benzonatate (TESSALON) 100 MG capsule Take 1 capsule (100 mg total) by mouth 3 (three) times daily as needed for cough. Do not take with alcohol or while driving or operating heavy machinery.  May cause drowsiness. 12/27/22   Valentino Nose, NP  prazosin (MINIPRESS) 1 MG capsule Take 1 capsule (1 mg total) by mouth at bedtime. For nightmares 04/26/18   Money, Gerlene Burdock, FNP  traZODone (DESYREL) 100 MG tablet Take 1 tablet (100 mg total) by mouth at bedtime as needed for sleep. 04/26/18   Money, Gerlene Burdock, FNP  venlafaxine XR (EFFEXOR-XR) 75 MG 24 hr capsule Take 1 capsule (75 mg total) by mouth daily with breakfast. For mood ocntrol 04/27/18   Money, Gerlene Burdock, FNP    Family History Family History  Family history unknown: Yes    Social History Social History   Tobacco Use   Smoking status: Former    Smokeless tobacco: Never  Substance Use Topics   Alcohol use: No   Drug use: No     Allergies   Patient has no known allergies.   Review of Systems Review of Systems Per HPI  Physical Exam Triage Vital Signs ED Triage Vitals  Enc Vitals Group     BP 12/27/22 1825 122/88     Pulse Rate 12/27/22 1825 78     Resp 12/27/22 1825 18     Temp 12/27/22 1825 98.5 F (36.9 C)     Temp Source 12/27/22 1825 Oral     SpO2 12/27/22 1825 96 %     Weight --      Height --      Head Circumference --      Peak Flow --      Pain Score 12/27/22 1826 10     Pain Loc --      Pain Edu? --      Excl. in [place]? --    No data found.  Updated Vital Signs BP 122/88 (BP Location: Right Wrist)   Pulse 78   Temp 98.5 F (36.9 C) (Oral)   Resp 18   SpO2 96%   Visual Acuity Right Eye Distance:   Left Eye Distance:   Bilateral Distance:    Right Eye Near:   Left Eye Near:  Bilateral Near:     Physical Exam Vitals and nursing note reviewed.  Constitutional:      General: He is not in acute distress.    Appearance: Normal appearance. He is not ill-appearing or toxic-appearing.  HENT:     Head: Normocephalic and atraumatic.     Right Ear: Tympanic membrane, ear canal and external ear normal.     Left Ear: Tympanic membrane, ear canal and external ear normal.     Nose: Congestion present. No rhinorrhea.     Mouth/Throat:     Mouth: Mucous membranes are moist.     Pharynx: Oropharynx is clear. No oropharyngeal exudate or posterior oropharyngeal erythema.  Eyes:     General: No scleral icterus.    Extraocular Movements: Extraocular movements intact.  Cardiovascular:     Rate and Rhythm: Normal rate and regular rhythm.  Pulmonary:     Effort: Pulmonary effort is normal. No respiratory distress.     Breath sounds: Normal breath sounds. No wheezing, rhonchi or rales.  Abdominal:     General: Abdomen is flat. Bowel sounds are normal. There is no distension.     Palpations: Abdomen  is soft.  Musculoskeletal:     Cervical back: Normal range of motion and neck supple.  Lymphadenopathy:     Cervical: No cervical adenopathy.  Skin:    General: Skin is warm and dry.     Coloration: Skin is not jaundiced or pale.     Findings: No erythema or rash.  Neurological:     Mental Status: He is alert and oriented to person, place, and time.  Psychiatric:        Behavior: Behavior is cooperative.      UC Treatments / Results  Labs (all labs ordered are listed, but only abnormal results are displayed) Labs Reviewed  SARS CORONAVIRUS 2 (TAT 6-24 HRS)  POCT RAPID STREP A (OFFICE)    EKG   Radiology No results found.  Procedures Procedures (including critical care time)  Medications Ordered in UC Medications - No data to display  Initial Impression / Assessment and Plan / UC Course  I have reviewed the triage vital signs and the nursing notes.  Pertinent labs & imaging results that were available during my care of the patient were reviewed by me and considered in my medical decision making (see chart for details).   Patient is well-appearing, normotensive, afebrile, not tachycardic, not tachypneic, oxygenating well on room air.    1. Acute pharyngitis, unspecified etiology 2. Encounter for screening for COVID-19 3. Viral URI with cough Rapid strep throat test today is negative Centor score today is 0 Throat culture deferred COVID-19 test is pending Supportive care discussed with patient Start cough suppressant medication ER and return precautions discussed Note given for work  The patient was given the opportunity to ask questions.  All questions answered to their satisfaction.  The patient is in agreement to this plan.    Final Clinical Impressions(s) / UC Diagnoses   Final diagnoses:  Acute pharyngitis, unspecified etiology  Encounter for screening for COVID-19  Viral URI with cough     Discharge Instructions      You have a viral upper  respiratory infection.  Symptoms should improve over the next week to 10 days.  If you develop chest pain or shortness of breath, go to the emergency room.  Rapid strep throat test today is negative.  We have tested you today for COVID-19.  You will see the results  in Mychart and we will call you with positive results.    Please stay home and isolate until you are aware of the results.    Some things that can make you feel better are: - Increased rest - Increasing fluid with water/sugar free electrolytes - Acetaminophen and ibuprofen as needed for fever/pain - Salt water gargling, chloraseptic spray and throat lozenges - OTC guaifenesin (Mucinex) 600 mg twice daily - Saline sinus flushes or a neti pot - Humidifying the air -Tessalon Perles during the day as needed for dry cough     ED Prescriptions     Medication Sig Dispense Auth. Provider   benzonatate (TESSALON) 100 MG capsule Take 1 capsule (100 mg total) by mouth 3 (three) times daily as needed for cough. Do not take with alcohol or while driving or operating heavy machinery.  May cause drowsiness. 30 capsule Valentino Nose, NP      PDMP not reviewed this encounter.   Valentino Nose, NP 12/27/22 [phone]

## 2022-12-27 NOTE — Discharge Instructions (Signed)
You have a viral upper respiratory infection.  Symptoms should improve over the next week to 10 days.  If you develop chest pain or shortness of breath, go to the emergency room.  Rapid strep throat test today is negative.  We have tested you today for COVID-19.  You will see the results in Mychart and we will call you with positive results.    Please stay home and isolate until you are aware of the results.    Some things that can make you feel better are: - Increased rest - Increasing fluid with water/sugar free electrolytes - Acetaminophen and ibuprofen as needed for fever/pain - Salt water gargling, chloraseptic spray and throat lozenges - OTC guaifenesin (Mucinex) 600 mg twice daily - Saline sinus flushes or a neti pot - Humidifying the air -Tessalon Perles during the day as needed for dry cough

## 2022-12-27 NOTE — ED Triage Notes (Signed)
Pt c/o cough and sore throat, bodyaches diarrhea  x 2 days

## 2022-12-28 LAB — SARS CORONAVIRUS 2 (TAT 6-24 HRS): SARS Coronavirus 2: NEGATIVE

## 2023-03-24 ENCOUNTER — Ambulatory Visit
Admission: EM | Admit: 2023-03-24 | Discharge: 2023-03-24 | Disposition: A | Discharge status: Home / Self Care | Payer: 59 | Attending: Family Medicine | Admitting: Family Medicine

## 2023-03-24 DIAGNOSIS — M79604 Pain in right leg: Secondary | ICD-10-CM

## 2023-03-24 MED ORDER — IBUPROFEN 600 MG PO TABS: 600.0000 mg | ORAL_TABLET | Freq: Four times a day (QID) | ORAL | 0 refills | Status: DC | PRN
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
## 2023-03-24 NOTE — ED Triage Notes (Signed)
Pt reports he needs a doctors note.

## 2023-11-21 ENCOUNTER — Emergency Department (HOSPITAL_COMMUNITY)

## 2023-11-21 ENCOUNTER — Other Ambulatory Visit: Payer: Self-pay

## 2023-11-21 ENCOUNTER — Encounter (HOSPITAL_COMMUNITY): Payer: Self-pay | Admitting: *Deleted

## 2023-11-21 ENCOUNTER — Emergency Department (HOSPITAL_COMMUNITY)
Admission: EM | Admit: 2023-11-21 | Discharge: 2023-11-21 | Disposition: A | Discharge status: Home / Self Care | Attending: Emergency Medicine | Admitting: Emergency Medicine

## 2023-11-21 DIAGNOSIS — R1110 Vomiting, unspecified: Secondary | ICD-10-CM | POA: Insufficient documentation

## 2023-11-21 DIAGNOSIS — J40 Bronchitis, not specified as acute or chronic: Secondary | ICD-10-CM | POA: Insufficient documentation

## 2023-11-21 LAB — COMPREHENSIVE METABOLIC PANEL WITH GFR
ALT: 25 U/L (ref 0–44)
AST: 18 U/L (ref 15–41)
Albumin: 3.9 g/dL (ref 3.5–5.0)
Alkaline Phosphatase: 44 U/L (ref 38–126)
Anion gap: 9 (ref 5–15)
BUN: 20 mg/dL (ref 6–20)
CO2: 27 mmol/L (ref 22–32)
Calcium: 9.5 mg/dL (ref 8.9–10.3)
Chloride: 105 mmol/L (ref 98–111)
Creatinine, Ser: 1.24 mg/dL (ref 0.61–1.24)
GFR, Estimated: >60 mL/min (ref >60)
Glucose, Bld: 90 mg/dL (ref 70–99)
Potassium: 4.2 mmol/L (ref 3.5–5.1)
Sodium: 141 mmol/L (ref 135–145)
Total Bilirubin: 0.8 mg/dL (ref 0.0–1.2)
Total Protein: 7.6 g/dL (ref 6.5–8.1)

## 2023-11-21 LAB — CBC WITH DIFFERENTIAL/PLATELET
Abs Immature Granulocytes: 0.03 10*3/uL (ref 0.00–0.07)
Basophils Absolute: 0 10*3/uL (ref 0.0–0.1)
Basophils Relative: 0 %
Eosinophils Absolute: 0.3 10*3/uL (ref 0.0–0.5)
Eosinophils Relative: 4 %
HCT: 48.7 % (ref 39.0–52.0)
Hemoglobin: 15.7 g/dL (ref 13.0–17.0)
Immature Granulocytes: 0 %
Lymphocytes Relative: 18 %
Lymphs Abs: 1.6 10*3/uL (ref 0.7–4.0)
MCH: 30.3 pg (ref 26.0–34.0)
MCHC: 32.2 g/dL (ref 30.0–36.0)
MCV: 94 fL (ref 80.0–100.0)
Monocytes Absolute: 1 10*3/uL (ref 0.1–1.0)
Monocytes Relative: 11 %
Neutro Abs: 6 10*3/uL (ref 1.7–7.7)
Neutrophils Relative %: 67 %
Platelets: 257 10*3/uL (ref 150–400)
RBC: 5.18 MIL/uL (ref 4.22–5.81)
RDW: 13.2 % (ref 11.5–15.5)
WBC: 8.9 10*3/uL (ref 4.0–10.5)
nRBC: 0 % (ref 0.0–0.2)

## 2023-11-21 LAB — RESP PANEL BY RT-PCR (RSV, FLU A&B, COVID)  RVPGX2
Influenza A by PCR: NEGATIVE
Influenza B by PCR: NEGATIVE
Resp Syncytial Virus by PCR: NEGATIVE
SARS Coronavirus 2 by RT PCR: NEGATIVE

## 2023-11-21 LAB — D-DIMER, QUANTITATIVE: D-Dimer, Quant: <0.27 ug{FEU}/mL (ref 0.00–0.50)

## 2023-11-21 LAB — TROPONIN I (HIGH SENSITIVITY): Troponin I (High Sensitivity): 6 ng/L (ref <18)

## 2023-11-21 MED ORDER — ONDANSETRON 4 MG PO TBDP: ORAL_TABLET | ORAL | 0 refills | Status: AC

## 2023-11-21 MED ORDER — ONDANSETRON 4 MG PO TBDP: ORAL_TABLET | ORAL | 0 refills | Status: DC

## 2023-11-21 MED ORDER — PANTOPRAZOLE SODIUM 40 MG IV SOLR
40.0000 mg | Freq: Once | INTRAVENOUS | Status: AC
  Administered 2023-11-21: 40 mg via INTRAVENOUS
  Filled 2023-11-21: qty 10

## 2023-11-21 MED ORDER — ONDANSETRON HCL 4 MG/2ML IJ SOLN
4.0000 mg | Freq: Once | INTRAMUSCULAR | Status: AC
  Administered 2023-11-21: 4 mg via INTRAVENOUS
  Filled 2023-11-21: qty 2

## 2023-11-21 MED ORDER — SODIUM CHLORIDE 0.9 % IV BOLUS
1000.0000 mL | Freq: Once | INTRAVENOUS | Status: AC
  Administered 2023-11-21: 1000 mL via INTRAVENOUS

## 2023-11-21 MED ORDER — IOHEXOL 300 MG/ML  SOLN
100.0000 mL | Freq: Once | INTRAMUSCULAR | Status: AC | PRN
  Administered 2023-11-21: 100 mL via INTRAVENOUS

## 2023-11-21 MED ORDER — HYDROMORPHONE HCL 1 MG/ML IJ SOLN
0.5000 mg | Freq: Once | INTRAMUSCULAR | Status: AC
  Administered 2023-11-21: 0.5 mg via INTRAVENOUS
  Filled 2023-11-21: qty 0.5

## 2023-11-21 NOTE — Discharge Instructions (Signed)
 Drink plenty of fluids.  Follow-up next week if not improving or return sooner to the ED if getting worse

## 2023-11-21 NOTE — ED Triage Notes (Signed)
 Pt  in  reports productive cough onset yesterday with light green sputum, pt reports onset of emesis yesterday with x 5 episodes in the last 24 hrs, denies diarrhea, reports rhinorrhea x 2 days ago, denies fever

## 2023-11-21 NOTE — ED Notes (Signed)
 Patient transported to CT

## 2023-11-22 NOTE — ED Provider Notes (Signed)
 [place] EMERGENCY DEPARTMENT AT [facility] Provider Note   CSN: [id number] Arrival date & time: 11/21/23  6295     History  Chief Complaint  Patient presents with   Emesis    Todd Long is a 28 y.o. male.  Patient complains of vomiting and coughing.  No fevers or chills  The history is provided by the patient and medical records. No language interpreter was used.  Emesis Severity:  Mild Timing:  Intermittent Quality:  Bilious material Able to tolerate:  Liquids Progression:  Improving Chronicity:  New Recent urination:  Normal Context: post-tussive   Relieved by:  Nothing Worsened by:  Nothing Associated symptoms: cough   Associated symptoms: no abdominal pain, no diarrhea and no headaches        Home Medications Prior to Admission medications   Medication Sig Start Date End Date Taking? Authorizing Provider  ondansetron (ZOFRAN-ODT) 4 MG disintegrating tablet 4mg  ODT q4 hours prn nausea/vomit 11/21/23  Yes Bethann Berkshire, MD  benzonatate (TESSALON) 100 MG capsule Take 1 capsule (100 mg total) by mouth 3 (three) times daily as needed for cough. Do not take with alcohol or while driving or operating heavy machinery.  May cause drowsiness. 12/27/22   Valentino Nose, NP  ibuprofen (ADVIL) 600 MG tablet Take 1 tablet (600 mg total) by mouth every 6 (six) hours as needed. 03/24/23   Particia Nearing, PA-C  prazosin (MINIPRESS) 1 MG capsule Take 1 capsule (1 mg total) by mouth at bedtime. For nightmares 04/26/18   Money, Gerlene Burdock, FNP  traZODone (DESYREL) 100 MG tablet Take 1 tablet (100 mg total) by mouth at bedtime as needed for sleep. 04/26/18   Money, Gerlene Burdock, FNP  venlafaxine XR (EFFEXOR-XR) 75 MG 24 hr capsule Take 1 capsule (75 mg total) by mouth daily with breakfast. For mood ocntrol 04/27/18   Money, Gerlene Burdock, FNP      Allergies    Patient has no known allergies.    Review of Systems   Review of Systems  Constitutional:  Negative for  appetite change and fatigue.  HENT:  Negative for congestion, ear discharge and sinus pressure.   Eyes:  Negative for discharge.  Respiratory:  Positive for cough.   Cardiovascular:  Negative for chest pain.  Gastrointestinal:  Positive for vomiting. Negative for abdominal pain and diarrhea.  Genitourinary:  Negative for frequency and hematuria.  Musculoskeletal:  Negative for back pain.  Skin:  Negative for rash.  Neurological:  Negative for seizures and headaches.  Psychiatric/Behavioral:  Negative for hallucinations.     Physical Exam Updated Vital Signs BP 120/88 (BP Location: Left Arm)   Pulse 80   Temp 98.5 F (36.9 C) (Oral)   Resp 18   Ht 5\' 8"  (1.727 m)   Wt (!) 181.4 kg   SpO2 97%   BMI 60.82 kg/m  Physical Exam Vitals and nursing note reviewed.  Constitutional:      Appearance: He is well-developed.  HENT:     Head: Normocephalic.     Nose: Nose normal.  Eyes:     General: No scleral icterus.    Conjunctiva/sclera: Conjunctivae normal.  Neck:     Thyroid: No thyromegaly.  Cardiovascular:     Rate and Rhythm: Normal rate and regular rhythm.     Heart sounds: No murmur heard.    No friction rub. No gallop.  Pulmonary:     Breath sounds: No stridor. No wheezing or rales.  Chest:  Chest wall: No tenderness.  Abdominal:     General: There is no distension.     Tenderness: There is no abdominal tenderness. There is no rebound.  Musculoskeletal:        General: Normal range of motion.     Cervical back: Neck supple.  Lymphadenopathy:     Cervical: No cervical adenopathy.  Skin:    Findings: No erythema or rash.  Neurological:     Mental Status: He is alert and oriented to person, place, and time.     Motor: No abnormal muscle tone.     Coordination: Coordination normal.  Psychiatric:        Behavior: Behavior normal.     ED Results / Procedures / Treatments   Labs (all labs ordered are listed, but only abnormal results are displayed) Labs  Reviewed  RESP PANEL BY RT-PCR (RSV, FLU A&B, COVID)  RVPGX2  CBC WITH DIFFERENTIAL/PLATELET  COMPREHENSIVE METABOLIC PANEL WITH GFR  D-DIMER, QUANTITATIVE  TROPONIN I (HIGH SENSITIVITY)    EKG None  Radiology CT ABDOMEN PELVIS W CONTRAST Result Date: 11/21/2023 CLINICAL DATA:  Acute abdominal pain EXAM: CT ABDOMEN AND PELVIS WITH CONTRAST TECHNIQUE: Multidetector CT imaging of the abdomen and pelvis was performed using the standard protocol following bolus administration of intravenous contrast. RADIATION DOSE REDUCTION: This exam was performed according to the departmental dose-optimization program which includes automated exposure control, adjustment of the mA and/or kV according to patient size and/or use of iterative reconstruction technique. CONTRAST:  OMNIPAQUE IOHEXOL 300 MG/ML  SOLN COMPARISON:  None Available. FINDINGS: Lower chest: No pleural or pericardial effusion. Visualized lung bases clear. Hepatobiliary: No focal liver abnormality is seen. No gallstones, gallbladder wall thickening, or biliary dilatation. Pancreas: Unremarkable. No pancreatic ductal dilatation or surrounding inflammatory changes. Spleen: Normal in size without focal abnormality. Adrenals/Urinary Tract: Adrenal glands are unremarkable. Kidneys are normal, without renal calculi, focal lesion, or hydronephrosis. Bladder is unremarkable. Stomach/Bowel: Stomach and small bowel are nondistended, unremarkable. Normal appendix. The colon is partially distended proximally, decompressed distally, without acute finding. Vascular/Lymphatic: No significant vascular findings are present. No enlarged abdominal or pelvic lymph nodes. Reproductive: Prostate is unremarkable. Other: Small paraumbilical hernia containing only mesenteric fat. no abdominopelvic ascites. Musculoskeletal: No acute or significant osseous findings. IMPRESSION: 1. No acute findings. 2. Small paraumbilical hernia containing only mesenteric fat. Electronically  Signed   By: Corlis Leak M.D.   On: 11/21/2023 13:35   DG ABD ACUTE 2+V W 1V CHEST Result Date: 11/21/2023 CLINICAL DATA:  Productive cough.  Emesis. EXAM: DG ABDOMEN ACUTE WITH 1 VIEW CHEST COMPARISON:  October 11, 2017. FINDINGS: There is no evidence of dilated bowel loops or free intraperitoneal air. No radiopaque calculi or other significant radiographic abnormality is seen. Mild cardiomegaly. Both lungs are clear. IMPRESSION: No abnormal bowel dilatation.  No acute cardiopulmonary disease. Electronically Signed   By: Lupita Raider M.D.   On: 11/21/2023 10:26    Procedures Procedures    Medications Ordered in ED Medications  sodium chloride 0.9 % bolus 1,000 mL (0 mLs Intravenous Stopped 11/21/23 1201)  ondansetron (ZOFRAN) injection 4 mg (4 mg Intravenous Given 11/21/23 0859)  HYDROmorphone (DILAUDID) injection 0.5 mg (0.5 mg Intravenous Given 11/21/23 1159)  ondansetron (ZOFRAN) injection 4 mg (4 mg Intravenous Given 11/21/23 1159)  pantoprazole (PROTONIX) injection 40 mg (40 mg Intravenous Given 11/21/23 1159)  iohexol (OMNIPAQUE) 300 MG/ML solution 100 mL (100 mLs Intravenous Contrast Given 11/21/23 1319)    ED Course/ Medical Decision  Making/ A&P                                 Medical Decision Making Amount and/or Complexity of Data Reviewed Labs: ordered. Radiology: ordered.  Risk Prescription drug management.   Patient with viral syndrome.  He is sent home with Zofran and will follow-up with his PCP if not improving        Final Clinical Impression(s) / ED Diagnoses Final diagnoses:  Bronchitis  Uncontrollable vomiting    Rx / DC Orders ED Discharge Orders          Ordered    ondansetron (ZOFRAN-ODT) 4 MG disintegrating tablet  Status:  Discontinued        11/21/23 1421    ondansetron (ZOFRAN-ODT) 4 MG disintegrating tablet        11/21/23 1430              Bethann Berkshire, MD 11/22/23 [phone]

## 2024-03-01 ENCOUNTER — Encounter: Payer: Self-pay | Admitting: Advanced Practice Midwife

## 2024-03-27 ENCOUNTER — Emergency Department (HOSPITAL_COMMUNITY)

## 2024-03-27 ENCOUNTER — Other Ambulatory Visit: Payer: Self-pay

## 2024-03-27 ENCOUNTER — Emergency Department (HOSPITAL_COMMUNITY)
Admission: EM | Admit: 2024-03-27 | Discharge: 2024-03-27 | Disposition: A | Discharge status: Home / Self Care | Attending: Emergency Medicine | Admitting: Emergency Medicine

## 2024-03-27 ENCOUNTER — Encounter (HOSPITAL_COMMUNITY): Payer: Self-pay

## 2024-03-27 DIAGNOSIS — X500XXA Overexertion from strenuous movement or load, initial encounter: Secondary | ICD-10-CM | POA: Diagnosis not present

## 2024-03-27 DIAGNOSIS — Y990 Civilian activity done for income or pay: Secondary | ICD-10-CM | POA: Insufficient documentation

## 2024-03-27 DIAGNOSIS — M25561 Pain in right knee: Secondary | ICD-10-CM | POA: Diagnosis present

## 2024-03-27 MED ORDER — IBUPROFEN 600 MG PO TABS: 600.0000 mg | ORAL_TABLET | Freq: Four times a day (QID) | ORAL | 0 refills | Status: AC | PRN

## 2024-03-27 NOTE — ED Notes (Signed)
 Pt gave a urine sample sent to lab

## 2024-03-27 NOTE — Progress Notes (Signed)
 Orthopedic Tech Progress Note Patient Details:  Todd Long 28-May-1996 [id number] Applied ace wrap to the right knee per order.  Ortho Devices Type of Ortho Device: Ace wrap Ortho Device/Splint Location: RLE Ortho Device/Splint Interventions: Ordered, Application, Adjustment   Post Interventions Patient Tolerated: Well Instructions Provided: Adjustment of device, Care of device, Poper ambulation with device  [name] 03/27/2024, 12:39 PM

## 2024-03-27 NOTE — ED Triage Notes (Signed)
 Pt presents to ED from home C/O R knee injury that happened yesterday while he was at work. Pt states he was lifting something heavy and felt a pop, and the pain has been getting worse. Denies fall.

## 2024-03-27 NOTE — ED Provider Notes (Signed)
 [place] EMERGENCY DEPARTMENT AT [facility] Provider Note   CSN: [id number] Arrival date & time: 03/27/24  1046     Patient presents with: Knee Injury   Todd Long is a 28 y.o. male who presents to the ED today with right knee pain after feeling a pop in his knee while he was at work yesterday while he was lifting a heavy object.  Medical admission over the last 24 hours the pain is progressively gotten worse which is located primarily to the medial aspect of the right knee as well as to the anterior aspect.  He has used some over-the-counter medication with some effect however feels that there is still discomfort in the right knee.   HPI     Prior to Admission medications   Medication Sig Start Date End Date Taking? Authorizing Provider  ibuprofen  (ADVIL ) 600 MG tablet Take 1 tablet (600 mg total) by mouth every 6 (six) hours as needed. 03/27/24  Yes Myriam Dorn BROCKS, PA  benzonatate  (TESSALON ) 100 MG capsule Take 1 capsule (100 mg total) by mouth 3 (three) times daily as needed for cough. Do not take with alcohol or while driving or operating heavy machinery.  May cause drowsiness. 12/27/22   Chandra Harlene LABOR, NP  ondansetron  (ZOFRAN -ODT) 4 MG disintegrating tablet 4mg  ODT q4 hours prn nausea/vomit 11/21/23   Zammit, Joseph, MD  prazosin  (MINIPRESS ) 1 MG capsule Take 1 capsule (1 mg total) by mouth at bedtime. For nightmares 04/26/18   Money, Caron NOVAK, FNP  traZODone  (DESYREL ) 100 MG tablet Take 1 tablet (100 mg total) by mouth at bedtime as needed for sleep. 04/26/18   Money, Caron NOVAK, FNP  venlafaxine  XR (EFFEXOR -XR) 75 MG 24 hr capsule Take 1 capsule (75 mg total) by mouth daily with breakfast. For mood ocntrol 04/27/18   Money, Caron NOVAK, FNP    Allergies: Patient has no known allergies.    Review of Systems  Musculoskeletal:  Positive for arthralgias.  All other systems reviewed and are negative.   Updated Vital Signs BP (!) 141/77   Pulse 85   Temp 98.8  F (37.1 C) (Oral)   Resp 19   Ht 5' 8 (1.727 m)   Wt (!) 181.4 kg   SpO2 95%   BMI 60.82 kg/m   Physical Exam Vitals and nursing note reviewed.  Constitutional:      General: He is not in acute distress.    Appearance: Normal appearance.  HENT:     Head: Normocephalic and atraumatic.     Mouth/Throat:     Mouth: Mucous membranes are moist.     Pharynx: Oropharynx is clear.  Eyes:     Extraocular Movements: Extraocular movements intact.     Conjunctiva/sclera: Conjunctivae normal.     Pupils: Pupils are equal, round, and reactive to light.  Cardiovascular:     Rate and Rhythm: Normal rate and regular rhythm.     Pulses: Normal pulses.     Heart sounds: Normal heart sounds. No murmur heard.    No friction rub. No gallop.  Pulmonary:     Effort: Pulmonary effort is normal.     Breath sounds: Normal breath sounds.  Abdominal:     General: Abdomen is flat. Bowel sounds are normal.     Palpations: Abdomen is soft.  Musculoskeletal:        General: Normal range of motion.     Cervical back: Normal range of motion and neck supple.  Right knee: No bony tenderness or crepitus. Tenderness present over the lateral joint line. No LCL laxity or MCL laxity. Normal patellar mobility.     Left knee: Normal.     Right lower leg: No edema.     Left lower leg: No edema.  Skin:    General: Skin is warm and dry.     Capillary Refill: Capillary refill takes less than 2 seconds.  Neurological:     General: No focal deficit present.     Mental Status: He is alert. Mental status is at baseline.  Psychiatric:        Mood and Affect: Mood normal.     (all labs ordered are listed, but only abnormal results are displayed) Labs Reviewed - No data to display  EKG: None  Radiology: DG Knee Complete 4 Views Right Result Date: 03/27/2024 CLINICAL DATA:  knee injury.  Pain. EXAM: RIGHT KNEE - COMPLETE 4+ VIEW COMPARISON:  None Available. FINDINGS: No acute fracture or dislocation. No  aggressive osseous lesion. The knee joint appears within normal limits. No significant arthritis. No knee effusion or focal soft tissue swelling. No radiopaque foreign bodies. IMPRESSION: No acute osseous abnormality of the right knee joint. Electronically Signed   By: Ree Molt M.D.   On: 03/27/2024 11:35     Procedures   Medications Ordered in the ED - No data to display                                  Medical Decision Making Amount and/or Complexity of Data Reviewed Radiology: ordered.  Risk Prescription drug management.   Medical Decision Making:   Todd Long is a 28 y.o. male who presented to the ED today with right knee pain detailed above.     Complete initial physical exam performed, notably the patient  was alert and oriented in no apparent distress.  Physical exam is notable for tenderness to the right knee.    Reviewed and confirmed nursing documentation for past medical history, family history, social history.    Initial Assessment:   With the patient's presentation of right knee pain, differential diagnosis extends from sprain of the ligaments of the lateral right knee, lateral meniscus tear, ACL tear.   Initial Plan:  Obtain plain film x-rays of the knee to assess for acute bony injuries Objective evaluation as below reviewed   Initial Study Results:   Radiology:  All images reviewed independently. Agree with radiology report at this time.   DG Knee Complete 4 Views Right Result Date: 03/27/2024 CLINICAL DATA:  knee injury.  Pain. EXAM: RIGHT KNEE - COMPLETE 4+ VIEW COMPARISON:  None Available. FINDINGS: No acute fracture or dislocation. No aggressive osseous lesion. The knee joint appears within normal limits. No significant arthritis. No knee effusion or focal soft tissue swelling. No radiopaque foreign bodies. IMPRESSION: No acute osseous abnormality of the right knee joint. Electronically Signed   By: Ree Molt M.D.   On: 03/27/2024 11:35     Reassessment and Plan:   # On physical exam of this patient along with x-ray which is negative for acute fracture or dislocation, believe this patient has pain either secondary to a right lateral meniscus tear or a right lateral collateral ligament sprain.  Will manage this with application of an Ace wrap to the knee, as well as manage pain with ibuprofen  as noted.  Will further give this patient referral  to to orthopedics for further assessment of the knee.       Final diagnoses:  Acute pain of right knee    ED Discharge Orders          Ordered    ibuprofen  (ADVIL ) 600 MG tablet  Every 6 hours PRN        03/27/24 1208               Myriam Dorn BROCKS, [place] 03/27/24 1244    Randol Simmonds, MD 03/28/24 1418

## 2024-06-09 ENCOUNTER — Emergency Department (HOSPITAL_COMMUNITY)
Admission: EM | Admit: 2024-06-09 | Discharge: 2024-06-09 | Disposition: A | Discharge status: Home / Self Care | Payer: MEDICAID | Attending: Emergency Medicine | Admitting: Emergency Medicine

## 2024-06-09 ENCOUNTER — Encounter (HOSPITAL_COMMUNITY): Payer: Self-pay

## 2024-06-09 ENCOUNTER — Emergency Department (HOSPITAL_COMMUNITY): Payer: MEDICAID

## 2024-06-09 ENCOUNTER — Other Ambulatory Visit: Payer: Self-pay

## 2024-06-09 DIAGNOSIS — R052 Subacute cough: Secondary | ICD-10-CM | POA: Diagnosis present

## 2024-06-09 DIAGNOSIS — J45909 Unspecified asthma, uncomplicated: Secondary | ICD-10-CM | POA: Diagnosis not present

## 2024-06-09 DIAGNOSIS — Z87891 Personal history of nicotine dependence: Secondary | ICD-10-CM | POA: Diagnosis not present

## 2024-06-09 LAB — RESP PANEL BY RT-PCR (RSV, FLU A&B, COVID)  RVPGX2
Influenza A by PCR: NEGATIVE
Influenza B by PCR: NEGATIVE
Resp Syncytial Virus by PCR: NEGATIVE
SARS Coronavirus 2 by RT PCR: NEGATIVE

## 2024-06-09 MED ORDER — ALBUTEROL SULFATE HFA 108 (90 BASE) MCG/ACT IN AERS: 2.0000 | INHALATION_SPRAY | RESPIRATORY_TRACT | 1 refills | Status: AC | PRN

## 2024-06-09 MED ORDER — PREDNISONE 50 MG PO TABS
60.0000 mg | ORAL_TABLET | Freq: Once | ORAL | Status: AC
  Administered 2024-06-09: 60 mg via ORAL
  Filled 2024-06-09: qty 1

## 2024-06-09 MED ORDER — AZITHROMYCIN 250 MG PO TABS
500.0000 mg | ORAL_TABLET | Freq: Once | ORAL | Status: AC
  Administered 2024-06-09: 500 mg via ORAL
  Filled 2024-06-09: qty 2

## 2024-06-09 MED ORDER — BENZONATATE 100 MG PO CAPS: 100.0000 mg | ORAL_CAPSULE | Freq: Three times a day (TID) | ORAL | 0 refills | Status: AC

## 2024-06-09 MED ORDER — AZITHROMYCIN 250 MG PO TABS: 250.0000 mg | ORAL_TABLET | Freq: Every day | ORAL | 0 refills | Status: AC

## 2024-06-09 MED ORDER — PREDNISONE 20 MG PO TABS: 40.0000 mg | ORAL_TABLET | Freq: Every day | ORAL | 0 refills | Status: AC

## 2024-06-09 NOTE — ED Provider Notes (Signed)
 [facility]Y DEPT [facility] Emergency Department Provider Note MRN:  [id number]  Arrival date & time: 06/09/24     Chief Complaint   Cough   History of Present Illness   Todd Long is a 28 y.o. year-old male with a history of asthma presenting to the ED with chief complaint of cough.  Persistent cough over the past 6 to 8 weeks, not going away, seems to be getting worse and more productive.  Trouble sleeping at night due to the persistent cough.  No chest pain, no shortness of breath but seems to be getting more tired with ambulation recently.  No leg pain or swelling.  Review of Systems  A thorough review of systems was obtained and all systems are negative except as noted in the HPI and PMH.   Patient's Health History    Past Medical History:  Diagnosis Date   Anxiety    Asthma    Depression    PTSD (post-traumatic stress disorder)    Suicidal behavior with attempted self-injury [facility])     Past Surgical History:  Procedure Laterality Date   TONSILLECTOMY      Family History  Family history unknown: Yes    Social History   Socioeconomic History   Marital status: Single    Spouse name: Not on file   Number of children: Not on file   Years of education: Not on file   Highest education level: Not on file  Occupational History   Not on file  Tobacco Use   Smoking status: Former   Smokeless tobacco: Never  Vaping Use   Vaping status: Never Used  Substance and Sexual Activity   Alcohol use: No   Drug use: No   Sexual activity: Not Currently  Other Topics Concern   Not on file  Social History Narrative   Not on file   Social Drivers of Health   Financial Resource Strain: Not on file  Food Insecurity: No Food Insecurity (03/11/2021)   Received from [facility]   Hunger Vital Sign    Within the past 12 months, you worried that your food would run out before you got the money to buy more.: Never true    Within the past 12 months, the food you  bought just didn't last and you didn't have money to get more.: Never true  Transportation Needs: Not on file  Physical Activity: Not on file  Stress: Not on file  Social Connections: Unknown (12/26/2021)   Received from [facility]   Social Network    Social Network: Not on file  Intimate Partner Violence: Unknown (11/18/2021)   Received from [facility]   HITS    Physically Hurt: Not on file    Insult or Talk Down To: Not on file    Threaten Physical Harm: Not on file    Scream or Curse: Not on file     Physical Exam   Vitals:   06/09/24 2229  BP: 119/72  Pulse: 95  Resp: (!) 22  Temp: 98.5 F (36.9 C)  SpO2: 96%    CONSTITUTIONAL: Well-appearing, NAD NEURO/PSYCH:  Alert and oriented x 3, no focal deficits EYES:  eyes equal and reactive ENT/NECK:  no LAD, no JVD CARDIO: Regular rate, well-perfused, normal S1 and S2 PULM:  CTAB no wheezing or rhonchi GI/GU:  non-distended, non-tender MSK/SPINE:  No gross deformities, no edema SKIN:  no rash, atraumatic   *Additional and/or pertinent findings included in MDM below  Diagnostic and Interventional  Summary    EKG Interpretation Date/Time:    Ventricular Rate:    PR Interval:    QRS Duration:    QT Interval:    QTC Calculation:   R Axis:      Text Interpretation:         Labs Reviewed  RESP PANEL BY RT-PCR (RSV, FLU A&B, COVID)  RVPGX2    DG Chest 2 View  Final Result      Medications  predniSONE (DELTASONE) tablet 60 mg (has no administration in time range)  azithromycin (ZITHROMAX) tablet 500 mg (has no administration in time range)     Procedures  /  Critical Care Procedures  ED Course and Medical Decision Making  Initial Impression and Ddx Well-appearing in no acute distress, favoring bronchitis versus pneumonia.  Doubt PE.  Past medical/surgical history that increases complexity of ED encounter: Asthma  Interpretation of Diagnostics I personally reviewed the Chest Xray and my  interpretation is as follows: No lobar opacity or pneumothorax    Patient Reassessment and Ultimate Disposition/Management     Doubt emergent process appropriate for discharge.  Patient management required discussion with the following services or consulting groups:  None  Complexity of Problems Addressed Acute complicated illness or Injury  Additional Data Reviewed and Analyzed Further history obtained from: None  Additional Factors Impacting ED Encounter Risk Prescriptions  Ozell HERO. Theadore, MD [facility] Emergency Medicine [facility] [email or web address]  Final Clinical Impressions(s) / ED Diagnoses     ICD-10-CM   1. Subacute cough  R05.2       ED Discharge Orders          Ordered    azithromycin (ZITHROMAX) 250 MG tablet  Daily        06/09/24 2314    predniSONE (DELTASONE) 20 MG tablet  Daily        06/09/24 2314    benzonatate  (TESSALON ) 100 MG capsule  Every 8 hours        06/09/24 2315    albuterol  (VENTOLIN  HFA) 108 (90 Base) MCG/ACT inhaler  Every 4 hours PRN        06/09/24 2315             Discharge Instructions Discussed with and Provided to Patient:    Discharge Instructions      You were evaluated in the Emergency Department and after careful evaluation, we did not find any emergent condition requiring admission or further testing in the hospital.  Your exam/testing today is overall reassuring.  Symptoms may be due to a walking pneumonia or bronchitis due to a bacteria.  Take the azithromycin antibiotic as directed.  Also recommend the prednisone medication to help calm down the inflammation in your lungs.  Can use the inhaler and/or the Tessalon  medication for cough as needed.  Please return to the Emergency Department if you experience any worsening of your condition.   Thank you for allowing us  to be a part of your care.      Theadore Ozell HERO, MD 06/09/24 [phone]

## 2024-06-09 NOTE — Discharge Instructions (Addendum)
 You were evaluated in the Emergency Department and after careful evaluation, we did not find any emergent condition requiring admission or further testing in the hospital.  Your exam/testing today is overall reassuring.  Symptoms may be due to a walking pneumonia or bronchitis due to a bacteria.  Take the azithromycin antibiotic as directed.  Also recommend the prednisone medication to help calm down the inflammation in your lungs.  Can use the inhaler and/or the Tessalon  medication for cough as needed.  Please return to the Emergency Department if you experience any worsening of your condition.   Thank you for allowing us  to be a part of your care.

## 2024-06-09 NOTE — ED Triage Notes (Signed)
 Pt arrives with c/o productive cough and congestion that has been going on for 2 months. Pt reports coughing up green sputum. Pt denies fevers.

## 2024-08-04 ENCOUNTER — Emergency Department (HOSPITAL_COMMUNITY): Admission: EM | Admit: 2024-08-04 | Discharge: 2024-08-04 | Disposition: A | Discharge status: Home / Self Care

## 2024-08-04 ENCOUNTER — Encounter (HOSPITAL_COMMUNITY): Payer: Self-pay

## 2024-08-04 ENCOUNTER — Other Ambulatory Visit: Payer: Self-pay

## 2024-08-04 ENCOUNTER — Emergency Department (HOSPITAL_COMMUNITY)

## 2024-08-04 DIAGNOSIS — R051 Acute cough: Secondary | ICD-10-CM | POA: Insufficient documentation

## 2024-08-04 DIAGNOSIS — K219 Gastro-esophageal reflux disease without esophagitis: Secondary | ICD-10-CM | POA: Diagnosis not present

## 2024-08-04 DIAGNOSIS — R059 Cough, unspecified: Secondary | ICD-10-CM | POA: Diagnosis present

## 2024-08-04 LAB — BASIC METABOLIC PANEL WITH GFR
Anion gap: 13 (ref 5–15)
BUN: 12 mg/dL (ref 6–20)
CO2: 23 mmol/L (ref 22–32)
Calcium: 9.1 mg/dL (ref 8.9–10.3)
Chloride: 105 mmol/L (ref 98–111)
Creatinine, Ser: 0.91 mg/dL (ref 0.61–1.24)
GFR, Estimated: >60 mL/min
Glucose, Bld: 93 mg/dL (ref 70–99)
Potassium: 3.9 mmol/L (ref 3.5–5.1)
Sodium: 141 mmol/L (ref 135–145)

## 2024-08-04 LAB — CBC WITH DIFFERENTIAL/PLATELET
Abs Immature Granulocytes: 0.03 K/uL (ref 0.00–0.07)
Basophils Absolute: 0.1 K/uL (ref 0.0–0.1)
Basophils Relative: 1 %
Eosinophils Absolute: 0.3 K/uL (ref 0.0–0.5)
Eosinophils Relative: 3 %
HCT: 49 % (ref 39.0–52.0)
Hemoglobin: 16 g/dL (ref 13.0–17.0)
Immature Granulocytes: 0 %
Lymphocytes Relative: 18 %
Lymphs Abs: 1.6 K/uL (ref 0.7–4.0)
MCH: 29.9 pg (ref 26.0–34.0)
MCHC: 32.7 g/dL (ref 30.0–36.0)
MCV: 91.6 fL (ref 80.0–100.0)
Monocytes Absolute: 0.8 K/uL (ref 0.1–1.0)
Monocytes Relative: 9 %
Neutro Abs: 6.2 K/uL (ref 1.7–7.7)
Neutrophils Relative %: 69 %
Platelets: 273 K/uL (ref 150–400)
RBC: 5.35 MIL/uL (ref 4.22–5.81)
RDW: 12.8 % (ref 11.5–15.5)
WBC: 9 K/uL (ref 4.0–10.5)
nRBC: 0 % (ref 0.0–0.2)

## 2024-08-04 MED ORDER — FAMOTIDINE 40 MG PO TABS: 40.0000 mg | ORAL_TABLET | Freq: Every day | ORAL | 0 refills | Status: AC

## 2024-08-04 MED ORDER — IOHEXOL 350 MG/ML SOLN
75.0000 mL | Freq: Once | INTRAVENOUS | Status: AC | PRN
  Administered 2024-08-04: 75 mL via INTRAVENOUS

## 2024-08-04 MED ORDER — HYDROCODONE BIT-HOMATROP MBR 5-1.5 MG/5ML PO SOLN: 5.0000 mL | Freq: Four times a day (QID) | ORAL | 0 refills | Status: AC | PRN

## 2024-08-04 MED ORDER — FAMOTIDINE 20 MG PO TABS
40.0000 mg | ORAL_TABLET | Freq: Once | ORAL | Status: AC
  Administered 2024-08-04: 40 mg via ORAL
  Filled 2024-08-04: qty 2

## 2024-08-04 MED ORDER — ALUM & MAG HYDROXIDE-SIMETH 200-200-20 MG/5ML PO SUSP
30.0000 mL | Freq: Once | ORAL | Status: AC
  Administered 2024-08-04: 30 mL via ORAL
  Filled 2024-08-04: qty 30

## 2024-08-04 MED ORDER — LIDOCAINE VISCOUS HCL 2 % MT SOLN
15.0000 mL | Freq: Once | OROMUCOSAL | Status: AC
  Administered 2024-08-04: 15 mL via ORAL
  Filled 2024-08-04: qty 15

## 2024-08-04 NOTE — ED Provider Notes (Signed)
 " [facility]MENT AT [facility] Provider Note   CSN: [id number] Arrival date & time: 08/04/24  9142     Patient presents with: Cough   Todd Long is a 28 y.o. male.  {Add pertinent medical, surgical, social history, OB history to HPI:3031} 28 year old male presents for evaluation of cough.  Patient states his symptoms for few months.  States he started coughing up phlegm recently.  States he was treated with antibiotics, inhalers, cough medicine and did not help at all.  States he never follow up after his last ER visit.  He states his symptoms get worse at nighttime.  Denies any shortness of breath or chest pain or any other symptoms or concerns.   Cough Associated symptoms: no chest pain, no chills, no ear pain, no fever, no rash, no shortness of breath and no sore throat        Prior to Admission medications  Medication Sig Start Date End Date Taking? Authorizing Provider  albuterol  (VENTOLIN  HFA) 108 (90 Base) MCG/ACT inhaler Inhale 2 puffs into the lungs every 4 (four) hours as needed for wheezing or shortness of breath. 06/09/24   Theadore Ozell HERO, MD  benzonatate  (TESSALON ) 100 MG capsule Take 1 capsule (100 mg total) by mouth every 8 (eight) hours. 06/09/24   Theadore Ozell HERO, MD  ibuprofen  (ADVIL ) 600 MG tablet Take 1 tablet (600 mg total) by mouth every 6 (six) hours as needed. 03/27/24   Myriam Dorn BROCKS, PA  ondansetron  (ZOFRAN -ODT) 4 MG disintegrating tablet 4mg  ODT q4 hours prn nausea/vomit 11/21/23   Zammit, Joseph, MD  prazosin  (MINIPRESS ) 1 MG capsule Take 1 capsule (1 mg total) by mouth at bedtime. For nightmares 04/26/18   Money, Caron NOVAK, FNP  traZODone  (DESYREL ) 100 MG tablet Take 1 tablet (100 mg total) by mouth at bedtime as needed for sleep. 04/26/18   Money, Caron NOVAK, FNP  venlafaxine  XR (EFFEXOR -XR) 75 MG 24 hr capsule Take 1 capsule (75 mg total) by mouth daily with breakfast. For mood ocntrol 04/27/18   Money, Caron NOVAK, FNP     Allergies: Patient has no known allergies.    Review of Systems  Constitutional:  Negative for chills and fever.  HENT:  Negative for ear pain and sore throat.   Eyes:  Negative for pain and visual disturbance.  Respiratory:  Positive for cough. Negative for shortness of breath.   Cardiovascular:  Negative for chest pain and palpitations.  Gastrointestinal:  Negative for abdominal pain and vomiting.  Genitourinary:  Negative for dysuria and hematuria.  Musculoskeletal:  Negative for arthralgias and back pain.  Skin:  Negative for color change and rash.  Neurological:  Negative for seizures and syncope.  All other systems reviewed and are negative.   Updated Vital Signs BP 119/88   Pulse 84   Temp 98.4 F (36.9 C) (Oral)   Resp (!) 22   Ht 5' 9 (1.753 m)   Wt (!) 176.9 kg   SpO2 94%   BMI 57.59 kg/m   Physical Exam Vitals and nursing note reviewed.  Constitutional:      General: He is not in acute distress.    Appearance: Normal appearance. He is well-developed. He is obese.  HENT:     Head: Normocephalic and atraumatic.  Eyes:     Conjunctiva/sclera: Conjunctivae normal.  Cardiovascular:     Rate and Rhythm: Normal rate and regular rhythm.     Heart sounds: No murmur heard. Pulmonary:  Effort: Pulmonary effort is normal. No respiratory distress.     Breath sounds: Normal breath sounds.  Abdominal:     Palpations: Abdomen is soft.     Tenderness: There is no abdominal tenderness.  Musculoskeletal:        General: No swelling.     Cervical back: Neck supple.  Skin:    General: Skin is warm and dry.     Capillary Refill: Capillary refill takes less than 2 seconds.  Neurological:     Mental Status: He is alert.  Psychiatric:        Mood and Affect: Mood normal.     (all labs ordered are listed, but only abnormal results are displayed) Labs Reviewed  BASIC METABOLIC PANEL WITH GFR  CBC WITH DIFFERENTIAL/PLATELET    EKG: EKG  Interpretation Date/Time:  Sunday August 04 2024 09:41:40 EST Ventricular Rate:  88 PR Interval:  195 QRS Duration:  82 QT Interval:  328 QTC Calculation: 397 R Axis:   135  Text Interpretation: Sinus rhythm Right axis deviation Low voltage, precordial leads Compared with prior EKG from 11/07/2005 Confirmed by Gennaro Bouchard ([id number]) on 08/04/2024 9:50:22 AM  Radiology: No results found.  {Document cardiac monitor, telemetry assessment procedure when appropriate:32947} Procedures   Medications Ordered in the ED - No data to display    {Click here for ABCD2, HEART and other calculators REFRESH Note before signing:1}                              Medical Decision Making Amount and/or Complexity of Data Reviewed Labs: ordered. Radiology: ordered.   ***  {Document critical care time when appropriate  Document review of labs and clinical decision tools ie [id number], etc  Document your independent review of radiology images and any outside records  Document your discussion with family members, caretakers and with consultants  Document social determinants of health affecting pt's care  Document your decision making why or why not admission, treatments were needed:32947:::1}   Final diagnoses:  None    ED Discharge Orders     None        "

## 2024-08-04 NOTE — Discharge Instructions (Signed)
 Take your Pepcid  daily as prescribed.  You can use your Hycodan as needed for cough.  You need to call and follow-up with a primary care doctor and discuss with them your symptoms and further follow-up with GI or pulmonology.

## 2024-08-04 NOTE — ED Triage Notes (Signed)
 Pt states he has been sick for weeks. States he has been coughing and congested for weeks, states he was here before and tested neg for covid and flu. Pt states he has blood in his mucus when he coughs.
